# Patient Record
Sex: Female | Born: 1935
Health system: Southern US, Community
[De-identification: ages and names within clinical notes are randomized; demographics above are authoritative.]

## PROBLEM LIST (undated history)

## (undated) DIAGNOSIS — C801 Malignant (primary) neoplasm, unspecified: Secondary | ICD-10-CM

## (undated) DIAGNOSIS — F40240 Claustrophobia: Secondary | ICD-10-CM

## (undated) DIAGNOSIS — I10 Essential (primary) hypertension: Secondary | ICD-10-CM

## (undated) DIAGNOSIS — N289 Disorder of kidney and ureter, unspecified: Secondary | ICD-10-CM

## (undated) HISTORY — PX: NEPHRECTOMY: SHX65

---

## 2015-11-27 DIAGNOSIS — M79674 Pain in right toe(s): Secondary | ICD-10-CM | POA: Diagnosis not present

## 2015-11-27 DIAGNOSIS — L603 Nail dystrophy: Secondary | ICD-10-CM | POA: Diagnosis not present

## 2015-11-27 DIAGNOSIS — I739 Peripheral vascular disease, unspecified: Secondary | ICD-10-CM | POA: Diagnosis not present

## 2015-11-27 DIAGNOSIS — M79675 Pain in left toe(s): Secondary | ICD-10-CM | POA: Diagnosis not present

## 2015-12-17 DIAGNOSIS — H2513 Age-related nuclear cataract, bilateral: Secondary | ICD-10-CM | POA: Diagnosis not present

## 2015-12-17 DIAGNOSIS — H40033 Anatomical narrow angle, bilateral: Secondary | ICD-10-CM | POA: Diagnosis not present

## 2016-01-27 DIAGNOSIS — R159 Full incontinence of feces: Secondary | ICD-10-CM | POA: Diagnosis not present

## 2016-01-27 DIAGNOSIS — F411 Generalized anxiety disorder: Secondary | ICD-10-CM | POA: Diagnosis not present

## 2016-01-27 DIAGNOSIS — K219 Gastro-esophageal reflux disease without esophagitis: Secondary | ICD-10-CM | POA: Diagnosis not present

## 2016-01-27 DIAGNOSIS — I10 Essential (primary) hypertension: Secondary | ICD-10-CM | POA: Diagnosis not present

## 2016-01-27 DIAGNOSIS — R2681 Unsteadiness on feet: Secondary | ICD-10-CM | POA: Diagnosis not present

## 2016-01-29 DIAGNOSIS — L03031 Cellulitis of right toe: Secondary | ICD-10-CM | POA: Diagnosis not present

## 2016-02-24 DIAGNOSIS — T8189XA Other complications of procedures, not elsewhere classified, initial encounter: Secondary | ICD-10-CM | POA: Diagnosis not present

## 2016-03-02 DIAGNOSIS — I10 Essential (primary) hypertension: Secondary | ICD-10-CM | POA: Diagnosis not present

## 2016-03-02 DIAGNOSIS — M1711 Unilateral primary osteoarthritis, right knee: Secondary | ICD-10-CM | POA: Diagnosis not present

## 2016-03-18 DIAGNOSIS — L600 Ingrowing nail: Secondary | ICD-10-CM | POA: Diagnosis not present

## 2016-03-18 DIAGNOSIS — M792 Neuralgia and neuritis, unspecified: Secondary | ICD-10-CM | POA: Diagnosis not present

## 2016-03-18 DIAGNOSIS — I739 Peripheral vascular disease, unspecified: Secondary | ICD-10-CM | POA: Diagnosis not present

## 2016-03-18 DIAGNOSIS — L603 Nail dystrophy: Secondary | ICD-10-CM | POA: Diagnosis not present

## 2016-03-26 DIAGNOSIS — K641 Second degree hemorrhoids: Secondary | ICD-10-CM | POA: Diagnosis not present

## 2016-04-02 DIAGNOSIS — L03032 Cellulitis of left toe: Secondary | ICD-10-CM | POA: Diagnosis not present

## 2016-04-29 DIAGNOSIS — M47812 Spondylosis without myelopathy or radiculopathy, cervical region: Secondary | ICD-10-CM | POA: Diagnosis not present

## 2016-04-29 DIAGNOSIS — S199XXA Unspecified injury of neck, initial encounter: Secondary | ICD-10-CM | POA: Diagnosis not present

## 2016-04-29 DIAGNOSIS — E039 Hypothyroidism, unspecified: Secondary | ICD-10-CM | POA: Diagnosis not present

## 2016-04-29 DIAGNOSIS — M542 Cervicalgia: Secondary | ICD-10-CM | POA: Diagnosis not present

## 2016-04-29 DIAGNOSIS — L989 Disorder of the skin and subcutaneous tissue, unspecified: Secondary | ICD-10-CM | POA: Diagnosis not present

## 2016-06-23 DIAGNOSIS — Z08 Encounter for follow-up examination after completed treatment for malignant neoplasm: Secondary | ICD-10-CM | POA: Diagnosis not present

## 2016-06-23 DIAGNOSIS — C44311 Basal cell carcinoma of skin of nose: Secondary | ICD-10-CM | POA: Diagnosis not present

## 2016-06-23 DIAGNOSIS — Z85828 Personal history of other malignant neoplasm of skin: Secondary | ICD-10-CM | POA: Diagnosis not present

## 2016-07-22 DIAGNOSIS — M792 Neuralgia and neuritis, unspecified: Secondary | ICD-10-CM | POA: Diagnosis not present

## 2016-08-17 DIAGNOSIS — M1711 Unilateral primary osteoarthritis, right knee: Secondary | ICD-10-CM | POA: Diagnosis not present

## 2016-08-17 DIAGNOSIS — Z23 Encounter for immunization: Secondary | ICD-10-CM | POA: Diagnosis not present

## 2016-10-29 DIAGNOSIS — M1711 Unilateral primary osteoarthritis, right knee: Secondary | ICD-10-CM | POA: Diagnosis not present

## 2016-10-29 DIAGNOSIS — R52 Pain, unspecified: Secondary | ICD-10-CM | POA: Diagnosis not present

## 2016-10-29 DIAGNOSIS — M170 Bilateral primary osteoarthritis of knee: Secondary | ICD-10-CM | POA: Diagnosis not present

## 2016-11-11 DIAGNOSIS — M1711 Unilateral primary osteoarthritis, right knee: Secondary | ICD-10-CM | POA: Diagnosis not present

## 2016-11-30 DIAGNOSIS — M1711 Unilateral primary osteoarthritis, right knee: Secondary | ICD-10-CM | POA: Diagnosis not present

## 2016-12-04 DIAGNOSIS — M791 Myalgia: Secondary | ICD-10-CM | POA: Diagnosis not present

## 2016-12-04 DIAGNOSIS — M542 Cervicalgia: Secondary | ICD-10-CM | POA: Diagnosis not present

## 2016-12-04 DIAGNOSIS — M47812 Spondylosis without myelopathy or radiculopathy, cervical region: Secondary | ICD-10-CM | POA: Diagnosis not present

## 2016-12-04 DIAGNOSIS — M40209 Unspecified kyphosis, site unspecified: Secondary | ICD-10-CM | POA: Diagnosis not present

## 2016-12-10 DIAGNOSIS — M1711 Unilateral primary osteoarthritis, right knee: Secondary | ICD-10-CM | POA: Diagnosis not present

## 2016-12-17 DIAGNOSIS — M7741 Metatarsalgia, right foot: Secondary | ICD-10-CM | POA: Diagnosis not present

## 2016-12-17 DIAGNOSIS — L602 Onychogryphosis: Secondary | ICD-10-CM | POA: Diagnosis not present

## 2016-12-17 DIAGNOSIS — M7742 Metatarsalgia, left foot: Secondary | ICD-10-CM | POA: Diagnosis not present

## 2016-12-31 DIAGNOSIS — F411 Generalized anxiety disorder: Secondary | ICD-10-CM | POA: Diagnosis not present

## 2016-12-31 DIAGNOSIS — I10 Essential (primary) hypertension: Secondary | ICD-10-CM | POA: Diagnosis not present

## 2016-12-31 DIAGNOSIS — F410 Panic disorder [episodic paroxysmal anxiety] without agoraphobia: Secondary | ICD-10-CM | POA: Diagnosis not present

## 2017-01-05 DIAGNOSIS — Z85828 Personal history of other malignant neoplasm of skin: Secondary | ICD-10-CM | POA: Diagnosis not present

## 2017-01-05 DIAGNOSIS — Z08 Encounter for follow-up examination after completed treatment for malignant neoplasm: Secondary | ICD-10-CM | POA: Diagnosis not present

## 2017-01-07 DIAGNOSIS — H903 Sensorineural hearing loss, bilateral: Secondary | ICD-10-CM | POA: Diagnosis not present

## 2017-01-08 DIAGNOSIS — J3089 Other allergic rhinitis: Secondary | ICD-10-CM | POA: Diagnosis not present

## 2017-01-08 DIAGNOSIS — I10 Essential (primary) hypertension: Secondary | ICD-10-CM | POA: Diagnosis not present

## 2017-01-08 DIAGNOSIS — F410 Panic disorder [episodic paroxysmal anxiety] without agoraphobia: Secondary | ICD-10-CM | POA: Diagnosis not present

## 2017-04-12 DIAGNOSIS — L602 Onychogryphosis: Secondary | ICD-10-CM | POA: Diagnosis not present

## 2017-05-17 DIAGNOSIS — H04123 Dry eye syndrome of bilateral lacrimal glands: Secondary | ICD-10-CM | POA: Diagnosis not present

## 2017-05-17 DIAGNOSIS — H40033 Anatomical narrow angle, bilateral: Secondary | ICD-10-CM | POA: Diagnosis not present

## 2017-07-08 DIAGNOSIS — Z85828 Personal history of other malignant neoplasm of skin: Secondary | ICD-10-CM | POA: Diagnosis not present

## 2017-07-08 DIAGNOSIS — Z08 Encounter for follow-up examination after completed treatment for malignant neoplasm: Secondary | ICD-10-CM | POA: Diagnosis not present

## 2017-08-04 DIAGNOSIS — Z23 Encounter for immunization: Secondary | ICD-10-CM | POA: Diagnosis not present

## 2017-08-09 DIAGNOSIS — E039 Hypothyroidism, unspecified: Secondary | ICD-10-CM | POA: Diagnosis not present

## 2017-08-09 DIAGNOSIS — E875 Hyperkalemia: Secondary | ICD-10-CM | POA: Diagnosis not present

## 2017-08-11 DIAGNOSIS — M47816 Spondylosis without myelopathy or radiculopathy, lumbar region: Secondary | ICD-10-CM | POA: Diagnosis not present

## 2017-08-12 DIAGNOSIS — M19072 Primary osteoarthritis, left ankle and foot: Secondary | ICD-10-CM | POA: Diagnosis not present

## 2017-08-12 DIAGNOSIS — M19071 Primary osteoarthritis, right ankle and foot: Secondary | ICD-10-CM | POA: Diagnosis not present

## 2017-08-12 DIAGNOSIS — L602 Onychogryphosis: Secondary | ICD-10-CM | POA: Diagnosis not present

## 2017-10-05 DIAGNOSIS — N289 Disorder of kidney and ureter, unspecified: Secondary | ICD-10-CM | POA: Diagnosis not present

## 2017-10-05 DIAGNOSIS — F410 Panic disorder [episodic paroxysmal anxiety] without agoraphobia: Secondary | ICD-10-CM | POA: Diagnosis not present

## 2017-10-05 DIAGNOSIS — F322 Major depressive disorder, single episode, severe without psychotic features: Secondary | ICD-10-CM | POA: Diagnosis not present

## 2017-10-05 DIAGNOSIS — I5022 Chronic systolic (congestive) heart failure: Secondary | ICD-10-CM | POA: Diagnosis not present

## 2017-10-06 DIAGNOSIS — R531 Weakness: Secondary | ICD-10-CM | POA: Diagnosis not present

## 2017-10-06 DIAGNOSIS — K641 Second degree hemorrhoids: Secondary | ICD-10-CM | POA: Diagnosis not present

## 2017-10-06 DIAGNOSIS — E039 Hypothyroidism, unspecified: Secondary | ICD-10-CM | POA: Diagnosis not present

## 2017-10-06 DIAGNOSIS — N390 Urinary tract infection, site not specified: Secondary | ICD-10-CM | POA: Diagnosis not present

## 2017-10-06 DIAGNOSIS — N183 Chronic kidney disease, stage 3 (moderate): Secondary | ICD-10-CM | POA: Diagnosis not present

## 2017-10-06 DIAGNOSIS — I10 Essential (primary) hypertension: Secondary | ICD-10-CM | POA: Diagnosis not present

## 2017-10-06 DIAGNOSIS — R0602 Shortness of breath: Secondary | ICD-10-CM | POA: Diagnosis not present

## 2017-10-06 DIAGNOSIS — I129 Hypertensive chronic kidney disease with stage 1 through stage 4 chronic kidney disease, or unspecified chronic kidney disease: Secondary | ICD-10-CM | POA: Diagnosis not present

## 2017-10-06 DIAGNOSIS — R42 Dizziness and giddiness: Secondary | ICD-10-CM | POA: Diagnosis not present

## 2017-10-06 DIAGNOSIS — K625 Hemorrhage of anus and rectum: Secondary | ICD-10-CM | POA: Diagnosis not present

## 2017-10-06 DIAGNOSIS — D649 Anemia, unspecified: Secondary | ICD-10-CM | POA: Diagnosis not present

## 2017-10-07 DIAGNOSIS — I10 Essential (primary) hypertension: Secondary | ICD-10-CM | POA: Diagnosis not present

## 2017-10-07 DIAGNOSIS — I083 Combined rheumatic disorders of mitral, aortic and tricuspid valves: Secondary | ICD-10-CM | POA: Diagnosis not present

## 2017-10-07 DIAGNOSIS — K641 Second degree hemorrhoids: Secondary | ICD-10-CM | POA: Diagnosis not present

## 2017-10-07 DIAGNOSIS — I517 Cardiomegaly: Secondary | ICD-10-CM | POA: Diagnosis not present

## 2017-10-07 DIAGNOSIS — N183 Chronic kidney disease, stage 3 (moderate): Secondary | ICD-10-CM | POA: Diagnosis not present

## 2017-10-07 DIAGNOSIS — E039 Hypothyroidism, unspecified: Secondary | ICD-10-CM | POA: Diagnosis not present

## 2017-10-07 DIAGNOSIS — D649 Anemia, unspecified: Secondary | ICD-10-CM | POA: Diagnosis not present

## 2017-10-07 DIAGNOSIS — I878 Other specified disorders of veins: Secondary | ICD-10-CM | POA: Diagnosis not present

## 2017-10-07 DIAGNOSIS — I519 Heart disease, unspecified: Secondary | ICD-10-CM | POA: Diagnosis not present

## 2017-10-08 DIAGNOSIS — K219 Gastro-esophageal reflux disease without esophagitis: Secondary | ICD-10-CM | POA: Diagnosis present

## 2017-10-08 DIAGNOSIS — D649 Anemia, unspecified: Secondary | ICD-10-CM | POA: Diagnosis present

## 2017-10-08 DIAGNOSIS — Z888 Allergy status to other drugs, medicaments and biological substances status: Secondary | ICD-10-CM | POA: Diagnosis not present

## 2017-10-08 DIAGNOSIS — F329 Major depressive disorder, single episode, unspecified: Secondary | ICD-10-CM | POA: Diagnosis present

## 2017-10-08 DIAGNOSIS — B9620 Unspecified Escherichia coli [E. coli] as the cause of diseases classified elsewhere: Secondary | ICD-10-CM | POA: Diagnosis present

## 2017-10-08 DIAGNOSIS — I129 Hypertensive chronic kidney disease with stage 1 through stage 4 chronic kidney disease, or unspecified chronic kidney disease: Secondary | ICD-10-CM | POA: Diagnosis present

## 2017-10-08 DIAGNOSIS — Z882 Allergy status to sulfonamides status: Secondary | ICD-10-CM | POA: Diagnosis not present

## 2017-10-08 DIAGNOSIS — F410 Panic disorder [episodic paroxysmal anxiety] without agoraphobia: Secondary | ICD-10-CM | POA: Diagnosis present

## 2017-10-08 DIAGNOSIS — E039 Hypothyroidism, unspecified: Secondary | ICD-10-CM | POA: Diagnosis present

## 2017-10-08 DIAGNOSIS — Z79899 Other long term (current) drug therapy: Secondary | ICD-10-CM | POA: Diagnosis not present

## 2017-10-08 DIAGNOSIS — N183 Chronic kidney disease, stage 3 (moderate): Secondary | ICD-10-CM | POA: Diagnosis present

## 2017-10-08 DIAGNOSIS — Z79891 Long term (current) use of opiate analgesic: Secondary | ICD-10-CM | POA: Diagnosis not present

## 2017-10-08 DIAGNOSIS — K641 Second degree hemorrhoids: Secondary | ICD-10-CM | POA: Diagnosis present

## 2017-10-08 DIAGNOSIS — I10 Essential (primary) hypertension: Secondary | ICD-10-CM | POA: Diagnosis not present

## 2017-10-08 DIAGNOSIS — I272 Pulmonary hypertension, unspecified: Secondary | ICD-10-CM | POA: Diagnosis present

## 2017-10-08 DIAGNOSIS — Z880 Allergy status to penicillin: Secondary | ICD-10-CM | POA: Diagnosis not present

## 2017-10-08 DIAGNOSIS — Z7982 Long term (current) use of aspirin: Secondary | ICD-10-CM | POA: Diagnosis not present

## 2017-10-08 DIAGNOSIS — N390 Urinary tract infection, site not specified: Secondary | ICD-10-CM | POA: Diagnosis present

## 2017-10-12 DIAGNOSIS — Z7982 Long term (current) use of aspirin: Secondary | ICD-10-CM | POA: Diagnosis not present

## 2017-10-12 DIAGNOSIS — K641 Second degree hemorrhoids: Secondary | ICD-10-CM | POA: Diagnosis not present

## 2017-10-12 DIAGNOSIS — I130 Hypertensive heart and chronic kidney disease with heart failure and stage 1 through stage 4 chronic kidney disease, or unspecified chronic kidney disease: Secondary | ICD-10-CM | POA: Diagnosis not present

## 2017-10-12 DIAGNOSIS — F322 Major depressive disorder, single episode, severe without psychotic features: Secondary | ICD-10-CM | POA: Diagnosis not present

## 2017-10-12 DIAGNOSIS — Z87440 Personal history of urinary (tract) infections: Secondary | ICD-10-CM | POA: Diagnosis not present

## 2017-10-12 DIAGNOSIS — N183 Chronic kidney disease, stage 3 (moderate): Secondary | ICD-10-CM | POA: Diagnosis not present

## 2017-10-12 DIAGNOSIS — Z9181 History of falling: Secondary | ICD-10-CM | POA: Diagnosis not present

## 2017-10-12 DIAGNOSIS — Z7951 Long term (current) use of inhaled steroids: Secondary | ICD-10-CM | POA: Diagnosis not present

## 2017-10-12 DIAGNOSIS — E039 Hypothyroidism, unspecified: Secondary | ICD-10-CM | POA: Diagnosis not present

## 2017-10-12 DIAGNOSIS — M47816 Spondylosis without myelopathy or radiculopathy, lumbar region: Secondary | ICD-10-CM | POA: Diagnosis not present

## 2017-10-12 DIAGNOSIS — D631 Anemia in chronic kidney disease: Secondary | ICD-10-CM | POA: Diagnosis not present

## 2017-10-12 DIAGNOSIS — I5022 Chronic systolic (congestive) heart failure: Secondary | ICD-10-CM | POA: Diagnosis not present

## 2017-10-12 DIAGNOSIS — F419 Anxiety disorder, unspecified: Secondary | ICD-10-CM | POA: Diagnosis not present

## 2017-10-20 DIAGNOSIS — I5022 Chronic systolic (congestive) heart failure: Secondary | ICD-10-CM | POA: Diagnosis not present

## 2017-10-20 DIAGNOSIS — N183 Chronic kidney disease, stage 3 (moderate): Secondary | ICD-10-CM | POA: Diagnosis not present

## 2017-10-20 DIAGNOSIS — D631 Anemia in chronic kidney disease: Secondary | ICD-10-CM | POA: Diagnosis not present

## 2017-10-20 DIAGNOSIS — F419 Anxiety disorder, unspecified: Secondary | ICD-10-CM | POA: Diagnosis not present

## 2017-10-20 DIAGNOSIS — K641 Second degree hemorrhoids: Secondary | ICD-10-CM | POA: Diagnosis not present

## 2017-10-20 DIAGNOSIS — I130 Hypertensive heart and chronic kidney disease with heart failure and stage 1 through stage 4 chronic kidney disease, or unspecified chronic kidney disease: Secondary | ICD-10-CM | POA: Diagnosis not present

## 2017-10-21 DIAGNOSIS — K641 Second degree hemorrhoids: Secondary | ICD-10-CM | POA: Diagnosis not present

## 2017-10-21 DIAGNOSIS — I130 Hypertensive heart and chronic kidney disease with heart failure and stage 1 through stage 4 chronic kidney disease, or unspecified chronic kidney disease: Secondary | ICD-10-CM | POA: Diagnosis not present

## 2017-10-21 DIAGNOSIS — F419 Anxiety disorder, unspecified: Secondary | ICD-10-CM | POA: Diagnosis not present

## 2017-10-21 DIAGNOSIS — D631 Anemia in chronic kidney disease: Secondary | ICD-10-CM | POA: Diagnosis not present

## 2017-10-21 DIAGNOSIS — I5022 Chronic systolic (congestive) heart failure: Secondary | ICD-10-CM | POA: Diagnosis not present

## 2017-10-21 DIAGNOSIS — N183 Chronic kidney disease, stage 3 (moderate): Secondary | ICD-10-CM | POA: Diagnosis not present

## 2017-10-23 DIAGNOSIS — I130 Hypertensive heart and chronic kidney disease with heart failure and stage 1 through stage 4 chronic kidney disease, or unspecified chronic kidney disease: Secondary | ICD-10-CM | POA: Diagnosis not present

## 2017-10-23 DIAGNOSIS — F419 Anxiety disorder, unspecified: Secondary | ICD-10-CM | POA: Diagnosis not present

## 2017-10-23 DIAGNOSIS — D631 Anemia in chronic kidney disease: Secondary | ICD-10-CM | POA: Diagnosis not present

## 2017-10-23 DIAGNOSIS — I5022 Chronic systolic (congestive) heart failure: Secondary | ICD-10-CM | POA: Diagnosis not present

## 2017-10-23 DIAGNOSIS — K641 Second degree hemorrhoids: Secondary | ICD-10-CM | POA: Diagnosis not present

## 2017-10-23 DIAGNOSIS — N183 Chronic kidney disease, stage 3 (moderate): Secondary | ICD-10-CM | POA: Diagnosis not present

## 2017-10-24 DIAGNOSIS — I5022 Chronic systolic (congestive) heart failure: Secondary | ICD-10-CM | POA: Diagnosis not present

## 2017-10-24 DIAGNOSIS — K641 Second degree hemorrhoids: Secondary | ICD-10-CM | POA: Diagnosis not present

## 2017-10-24 DIAGNOSIS — I130 Hypertensive heart and chronic kidney disease with heart failure and stage 1 through stage 4 chronic kidney disease, or unspecified chronic kidney disease: Secondary | ICD-10-CM | POA: Diagnosis not present

## 2017-10-24 DIAGNOSIS — D631 Anemia in chronic kidney disease: Secondary | ICD-10-CM | POA: Diagnosis not present

## 2017-10-24 DIAGNOSIS — F419 Anxiety disorder, unspecified: Secondary | ICD-10-CM | POA: Diagnosis not present

## 2017-10-24 DIAGNOSIS — N183 Chronic kidney disease, stage 3 (moderate): Secondary | ICD-10-CM | POA: Diagnosis not present

## 2017-10-26 DIAGNOSIS — I130 Hypertensive heart and chronic kidney disease with heart failure and stage 1 through stage 4 chronic kidney disease, or unspecified chronic kidney disease: Secondary | ICD-10-CM | POA: Diagnosis not present

## 2017-10-26 DIAGNOSIS — F419 Anxiety disorder, unspecified: Secondary | ICD-10-CM | POA: Diagnosis not present

## 2017-10-26 DIAGNOSIS — N183 Chronic kidney disease, stage 3 (moderate): Secondary | ICD-10-CM | POA: Diagnosis not present

## 2017-10-26 DIAGNOSIS — D631 Anemia in chronic kidney disease: Secondary | ICD-10-CM | POA: Diagnosis not present

## 2017-10-26 DIAGNOSIS — K641 Second degree hemorrhoids: Secondary | ICD-10-CM | POA: Diagnosis not present

## 2017-10-26 DIAGNOSIS — I5022 Chronic systolic (congestive) heart failure: Secondary | ICD-10-CM | POA: Diagnosis not present

## 2017-10-28 DIAGNOSIS — F419 Anxiety disorder, unspecified: Secondary | ICD-10-CM | POA: Diagnosis not present

## 2017-10-28 DIAGNOSIS — N183 Chronic kidney disease, stage 3 (moderate): Secondary | ICD-10-CM | POA: Diagnosis not present

## 2017-10-28 DIAGNOSIS — K641 Second degree hemorrhoids: Secondary | ICD-10-CM | POA: Diagnosis not present

## 2017-10-28 DIAGNOSIS — I130 Hypertensive heart and chronic kidney disease with heart failure and stage 1 through stage 4 chronic kidney disease, or unspecified chronic kidney disease: Secondary | ICD-10-CM | POA: Diagnosis not present

## 2017-10-28 DIAGNOSIS — D631 Anemia in chronic kidney disease: Secondary | ICD-10-CM | POA: Diagnosis not present

## 2017-10-28 DIAGNOSIS — I5022 Chronic systolic (congestive) heart failure: Secondary | ICD-10-CM | POA: Diagnosis not present

## 2017-10-29 DIAGNOSIS — I130 Hypertensive heart and chronic kidney disease with heart failure and stage 1 through stage 4 chronic kidney disease, or unspecified chronic kidney disease: Secondary | ICD-10-CM | POA: Diagnosis not present

## 2017-10-29 DIAGNOSIS — D631 Anemia in chronic kidney disease: Secondary | ICD-10-CM | POA: Diagnosis not present

## 2017-10-29 DIAGNOSIS — K641 Second degree hemorrhoids: Secondary | ICD-10-CM | POA: Diagnosis not present

## 2017-10-29 DIAGNOSIS — N183 Chronic kidney disease, stage 3 (moderate): Secondary | ICD-10-CM | POA: Diagnosis not present

## 2017-10-29 DIAGNOSIS — F419 Anxiety disorder, unspecified: Secondary | ICD-10-CM | POA: Diagnosis not present

## 2017-10-29 DIAGNOSIS — I5022 Chronic systolic (congestive) heart failure: Secondary | ICD-10-CM | POA: Diagnosis not present

## 2017-11-04 DIAGNOSIS — I5022 Chronic systolic (congestive) heart failure: Secondary | ICD-10-CM | POA: Diagnosis not present

## 2017-11-04 DIAGNOSIS — N183 Chronic kidney disease, stage 3 (moderate): Secondary | ICD-10-CM | POA: Diagnosis not present

## 2017-11-04 DIAGNOSIS — K641 Second degree hemorrhoids: Secondary | ICD-10-CM | POA: Diagnosis not present

## 2017-11-04 DIAGNOSIS — I130 Hypertensive heart and chronic kidney disease with heart failure and stage 1 through stage 4 chronic kidney disease, or unspecified chronic kidney disease: Secondary | ICD-10-CM | POA: Diagnosis not present

## 2017-11-04 DIAGNOSIS — D631 Anemia in chronic kidney disease: Secondary | ICD-10-CM | POA: Diagnosis not present

## 2017-11-04 DIAGNOSIS — F419 Anxiety disorder, unspecified: Secondary | ICD-10-CM | POA: Diagnosis not present

## 2017-11-05 DIAGNOSIS — K641 Second degree hemorrhoids: Secondary | ICD-10-CM | POA: Diagnosis not present

## 2017-11-05 DIAGNOSIS — N183 Chronic kidney disease, stage 3 (moderate): Secondary | ICD-10-CM | POA: Diagnosis not present

## 2017-11-05 DIAGNOSIS — I130 Hypertensive heart and chronic kidney disease with heart failure and stage 1 through stage 4 chronic kidney disease, or unspecified chronic kidney disease: Secondary | ICD-10-CM | POA: Diagnosis not present

## 2017-11-05 DIAGNOSIS — I5022 Chronic systolic (congestive) heart failure: Secondary | ICD-10-CM | POA: Diagnosis not present

## 2017-11-05 DIAGNOSIS — F419 Anxiety disorder, unspecified: Secondary | ICD-10-CM | POA: Diagnosis not present

## 2017-11-05 DIAGNOSIS — D631 Anemia in chronic kidney disease: Secondary | ICD-10-CM | POA: Diagnosis not present

## 2017-11-12 DIAGNOSIS — I130 Hypertensive heart and chronic kidney disease with heart failure and stage 1 through stage 4 chronic kidney disease, or unspecified chronic kidney disease: Secondary | ICD-10-CM | POA: Diagnosis not present

## 2017-11-12 DIAGNOSIS — K641 Second degree hemorrhoids: Secondary | ICD-10-CM | POA: Diagnosis not present

## 2017-11-12 DIAGNOSIS — D631 Anemia in chronic kidney disease: Secondary | ICD-10-CM | POA: Diagnosis not present

## 2017-11-12 DIAGNOSIS — I5022 Chronic systolic (congestive) heart failure: Secondary | ICD-10-CM | POA: Diagnosis not present

## 2017-11-12 DIAGNOSIS — F419 Anxiety disorder, unspecified: Secondary | ICD-10-CM | POA: Diagnosis not present

## 2017-11-12 DIAGNOSIS — N183 Chronic kidney disease, stage 3 (moderate): Secondary | ICD-10-CM | POA: Diagnosis not present

## 2017-11-13 DIAGNOSIS — F419 Anxiety disorder, unspecified: Secondary | ICD-10-CM | POA: Diagnosis not present

## 2017-11-13 DIAGNOSIS — D631 Anemia in chronic kidney disease: Secondary | ICD-10-CM | POA: Diagnosis not present

## 2017-11-13 DIAGNOSIS — I5022 Chronic systolic (congestive) heart failure: Secondary | ICD-10-CM | POA: Diagnosis not present

## 2017-11-13 DIAGNOSIS — I130 Hypertensive heart and chronic kidney disease with heart failure and stage 1 through stage 4 chronic kidney disease, or unspecified chronic kidney disease: Secondary | ICD-10-CM | POA: Diagnosis not present

## 2017-11-13 DIAGNOSIS — K641 Second degree hemorrhoids: Secondary | ICD-10-CM | POA: Diagnosis not present

## 2017-11-13 DIAGNOSIS — N183 Chronic kidney disease, stage 3 (moderate): Secondary | ICD-10-CM | POA: Diagnosis not present

## 2017-11-15 DIAGNOSIS — N183 Chronic kidney disease, stage 3 (moderate): Secondary | ICD-10-CM | POA: Diagnosis not present

## 2017-11-15 DIAGNOSIS — I5022 Chronic systolic (congestive) heart failure: Secondary | ICD-10-CM | POA: Diagnosis not present

## 2017-11-15 DIAGNOSIS — F419 Anxiety disorder, unspecified: Secondary | ICD-10-CM | POA: Diagnosis not present

## 2017-11-15 DIAGNOSIS — I130 Hypertensive heart and chronic kidney disease with heart failure and stage 1 through stage 4 chronic kidney disease, or unspecified chronic kidney disease: Secondary | ICD-10-CM | POA: Diagnosis not present

## 2017-11-15 DIAGNOSIS — K641 Second degree hemorrhoids: Secondary | ICD-10-CM | POA: Diagnosis not present

## 2017-11-15 DIAGNOSIS — D631 Anemia in chronic kidney disease: Secondary | ICD-10-CM | POA: Diagnosis not present

## 2017-11-18 DIAGNOSIS — I130 Hypertensive heart and chronic kidney disease with heart failure and stage 1 through stage 4 chronic kidney disease, or unspecified chronic kidney disease: Secondary | ICD-10-CM | POA: Diagnosis not present

## 2017-11-18 DIAGNOSIS — D631 Anemia in chronic kidney disease: Secondary | ICD-10-CM | POA: Diagnosis not present

## 2017-11-18 DIAGNOSIS — F419 Anxiety disorder, unspecified: Secondary | ICD-10-CM | POA: Diagnosis not present

## 2017-11-18 DIAGNOSIS — I5022 Chronic systolic (congestive) heart failure: Secondary | ICD-10-CM | POA: Diagnosis not present

## 2017-11-18 DIAGNOSIS — K641 Second degree hemorrhoids: Secondary | ICD-10-CM | POA: Diagnosis not present

## 2017-11-18 DIAGNOSIS — N183 Chronic kidney disease, stage 3 (moderate): Secondary | ICD-10-CM | POA: Diagnosis not present

## 2017-11-19 DIAGNOSIS — K641 Second degree hemorrhoids: Secondary | ICD-10-CM | POA: Diagnosis not present

## 2017-11-19 DIAGNOSIS — E039 Hypothyroidism, unspecified: Secondary | ICD-10-CM | POA: Diagnosis not present

## 2017-11-19 DIAGNOSIS — F322 Major depressive disorder, single episode, severe without psychotic features: Secondary | ICD-10-CM | POA: Diagnosis not present

## 2017-11-19 DIAGNOSIS — D631 Anemia in chronic kidney disease: Secondary | ICD-10-CM | POA: Diagnosis not present

## 2017-11-19 DIAGNOSIS — I130 Hypertensive heart and chronic kidney disease with heart failure and stage 1 through stage 4 chronic kidney disease, or unspecified chronic kidney disease: Secondary | ICD-10-CM | POA: Diagnosis not present

## 2017-11-19 DIAGNOSIS — R609 Edema, unspecified: Secondary | ICD-10-CM | POA: Diagnosis not present

## 2017-11-19 DIAGNOSIS — R5381 Other malaise: Secondary | ICD-10-CM | POA: Diagnosis not present

## 2017-11-19 DIAGNOSIS — N183 Chronic kidney disease, stage 3 (moderate): Secondary | ICD-10-CM | POA: Diagnosis not present

## 2017-11-19 DIAGNOSIS — M542 Cervicalgia: Secondary | ICD-10-CM | POA: Diagnosis not present

## 2017-11-19 DIAGNOSIS — F419 Anxiety disorder, unspecified: Secondary | ICD-10-CM | POA: Diagnosis not present

## 2017-11-19 DIAGNOSIS — N289 Disorder of kidney and ureter, unspecified: Secondary | ICD-10-CM | POA: Diagnosis not present

## 2017-11-19 DIAGNOSIS — I5022 Chronic systolic (congestive) heart failure: Secondary | ICD-10-CM | POA: Diagnosis not present

## 2017-12-01 DIAGNOSIS — N184 Chronic kidney disease, stage 4 (severe): Secondary | ICD-10-CM | POA: Diagnosis not present

## 2017-12-16 DIAGNOSIS — L602 Onychogryphosis: Secondary | ICD-10-CM | POA: Diagnosis not present

## 2017-12-17 DIAGNOSIS — N184 Chronic kidney disease, stage 4 (severe): Secondary | ICD-10-CM | POA: Diagnosis not present

## 2018-01-10 DIAGNOSIS — N184 Chronic kidney disease, stage 4 (severe): Secondary | ICD-10-CM | POA: Diagnosis not present

## 2018-02-01 DIAGNOSIS — R5381 Other malaise: Secondary | ICD-10-CM | POA: Diagnosis not present

## 2018-02-01 DIAGNOSIS — I10 Essential (primary) hypertension: Secondary | ICD-10-CM | POA: Diagnosis not present

## 2018-02-01 DIAGNOSIS — F410 Panic disorder [episodic paroxysmal anxiety] without agoraphobia: Secondary | ICD-10-CM | POA: Diagnosis not present

## 2018-02-23 DIAGNOSIS — D631 Anemia in chronic kidney disease: Secondary | ICD-10-CM | POA: Diagnosis not present

## 2018-02-23 DIAGNOSIS — I130 Hypertensive heart and chronic kidney disease with heart failure and stage 1 through stage 4 chronic kidney disease, or unspecified chronic kidney disease: Secondary | ICD-10-CM | POA: Diagnosis not present

## 2018-02-23 DIAGNOSIS — Z9181 History of falling: Secondary | ICD-10-CM | POA: Diagnosis not present

## 2018-02-23 DIAGNOSIS — Z87440 Personal history of urinary (tract) infections: Secondary | ICD-10-CM | POA: Diagnosis not present

## 2018-02-23 DIAGNOSIS — Z7982 Long term (current) use of aspirin: Secondary | ICD-10-CM | POA: Diagnosis not present

## 2018-02-23 DIAGNOSIS — F322 Major depressive disorder, single episode, severe without psychotic features: Secondary | ICD-10-CM | POA: Diagnosis not present

## 2018-02-23 DIAGNOSIS — K641 Second degree hemorrhoids: Secondary | ICD-10-CM | POA: Diagnosis not present

## 2018-02-23 DIAGNOSIS — I5022 Chronic systolic (congestive) heart failure: Secondary | ICD-10-CM | POA: Diagnosis not present

## 2018-02-23 DIAGNOSIS — I272 Pulmonary hypertension, unspecified: Secondary | ICD-10-CM | POA: Diagnosis not present

## 2018-02-23 DIAGNOSIS — F410 Panic disorder [episodic paroxysmal anxiety] without agoraphobia: Secondary | ICD-10-CM | POA: Diagnosis not present

## 2018-02-23 DIAGNOSIS — M47816 Spondylosis without myelopathy or radiculopathy, lumbar region: Secondary | ICD-10-CM | POA: Diagnosis not present

## 2018-02-23 DIAGNOSIS — Z7951 Long term (current) use of inhaled steroids: Secondary | ICD-10-CM | POA: Diagnosis not present

## 2018-02-23 DIAGNOSIS — E039 Hypothyroidism, unspecified: Secondary | ICD-10-CM | POA: Diagnosis not present

## 2018-02-23 DIAGNOSIS — N183 Chronic kidney disease, stage 3 (moderate): Secondary | ICD-10-CM | POA: Diagnosis not present

## 2018-02-28 DIAGNOSIS — M47816 Spondylosis without myelopathy or radiculopathy, lumbar region: Secondary | ICD-10-CM | POA: Diagnosis not present

## 2018-02-28 DIAGNOSIS — D631 Anemia in chronic kidney disease: Secondary | ICD-10-CM | POA: Diagnosis not present

## 2018-02-28 DIAGNOSIS — I130 Hypertensive heart and chronic kidney disease with heart failure and stage 1 through stage 4 chronic kidney disease, or unspecified chronic kidney disease: Secondary | ICD-10-CM | POA: Diagnosis not present

## 2018-02-28 DIAGNOSIS — I5022 Chronic systolic (congestive) heart failure: Secondary | ICD-10-CM | POA: Diagnosis not present

## 2018-02-28 DIAGNOSIS — F410 Panic disorder [episodic paroxysmal anxiety] without agoraphobia: Secondary | ICD-10-CM | POA: Diagnosis not present

## 2018-02-28 DIAGNOSIS — N183 Chronic kidney disease, stage 3 (moderate): Secondary | ICD-10-CM | POA: Diagnosis not present

## 2018-03-02 DIAGNOSIS — M47816 Spondylosis without myelopathy or radiculopathy, lumbar region: Secondary | ICD-10-CM | POA: Diagnosis not present

## 2018-03-02 DIAGNOSIS — F410 Panic disorder [episodic paroxysmal anxiety] without agoraphobia: Secondary | ICD-10-CM | POA: Diagnosis not present

## 2018-03-02 DIAGNOSIS — I5022 Chronic systolic (congestive) heart failure: Secondary | ICD-10-CM | POA: Diagnosis not present

## 2018-03-02 DIAGNOSIS — D631 Anemia in chronic kidney disease: Secondary | ICD-10-CM | POA: Diagnosis not present

## 2018-03-02 DIAGNOSIS — I130 Hypertensive heart and chronic kidney disease with heart failure and stage 1 through stage 4 chronic kidney disease, or unspecified chronic kidney disease: Secondary | ICD-10-CM | POA: Diagnosis not present

## 2018-03-02 DIAGNOSIS — N183 Chronic kidney disease, stage 3 (moderate): Secondary | ICD-10-CM | POA: Diagnosis not present

## 2018-03-03 DIAGNOSIS — D62 Acute posthemorrhagic anemia: Secondary | ICD-10-CM | POA: Diagnosis not present

## 2018-03-03 DIAGNOSIS — K219 Gastro-esophageal reflux disease without esophagitis: Secondary | ICD-10-CM | POA: Diagnosis not present

## 2018-03-03 DIAGNOSIS — R531 Weakness: Secondary | ICD-10-CM | POA: Diagnosis not present

## 2018-03-03 DIAGNOSIS — F410 Panic disorder [episodic paroxysmal anxiety] without agoraphobia: Secondary | ICD-10-CM | POA: Diagnosis not present

## 2018-03-03 DIAGNOSIS — K5730 Diverticulosis of large intestine without perforation or abscess without bleeding: Secondary | ICD-10-CM | POA: Diagnosis not present

## 2018-03-03 DIAGNOSIS — I130 Hypertensive heart and chronic kidney disease with heart failure and stage 1 through stage 4 chronic kidney disease, or unspecified chronic kidney disease: Secondary | ICD-10-CM | POA: Diagnosis not present

## 2018-03-03 DIAGNOSIS — K625 Hemorrhage of anus and rectum: Secondary | ICD-10-CM | POA: Diagnosis not present

## 2018-03-03 DIAGNOSIS — Z7982 Long term (current) use of aspirin: Secondary | ICD-10-CM | POA: Diagnosis not present

## 2018-03-03 DIAGNOSIS — R5383 Other fatigue: Secondary | ICD-10-CM | POA: Diagnosis not present

## 2018-03-03 DIAGNOSIS — N3001 Acute cystitis with hematuria: Secondary | ICD-10-CM | POA: Diagnosis not present

## 2018-03-03 DIAGNOSIS — M47816 Spondylosis without myelopathy or radiculopathy, lumbar region: Secondary | ICD-10-CM | POA: Diagnosis not present

## 2018-03-03 DIAGNOSIS — D259 Leiomyoma of uterus, unspecified: Secondary | ICD-10-CM | POA: Diagnosis not present

## 2018-03-03 DIAGNOSIS — R1084 Generalized abdominal pain: Secondary | ICD-10-CM | POA: Diagnosis not present

## 2018-03-03 DIAGNOSIS — D631 Anemia in chronic kidney disease: Secondary | ICD-10-CM | POA: Diagnosis not present

## 2018-03-03 DIAGNOSIS — K8689 Other specified diseases of pancreas: Secondary | ICD-10-CM | POA: Diagnosis not present

## 2018-03-03 DIAGNOSIS — K922 Gastrointestinal hemorrhage, unspecified: Secondary | ICD-10-CM | POA: Diagnosis not present

## 2018-03-03 DIAGNOSIS — N184 Chronic kidney disease, stage 4 (severe): Secondary | ICD-10-CM | POA: Diagnosis not present

## 2018-03-03 DIAGNOSIS — N183 Chronic kidney disease, stage 3 (moderate): Secondary | ICD-10-CM | POA: Diagnosis not present

## 2018-03-03 DIAGNOSIS — I5022 Chronic systolic (congestive) heart failure: Secondary | ICD-10-CM | POA: Diagnosis not present

## 2018-03-04 DIAGNOSIS — E039 Hypothyroidism, unspecified: Secondary | ICD-10-CM | POA: Diagnosis present

## 2018-03-04 DIAGNOSIS — N184 Chronic kidney disease, stage 4 (severe): Secondary | ICD-10-CM | POA: Diagnosis present

## 2018-03-04 DIAGNOSIS — Z888 Allergy status to other drugs, medicaments and biological substances status: Secondary | ICD-10-CM | POA: Diagnosis not present

## 2018-03-04 DIAGNOSIS — I10 Essential (primary) hypertension: Secondary | ICD-10-CM | POA: Diagnosis not present

## 2018-03-04 DIAGNOSIS — Z882 Allergy status to sulfonamides status: Secondary | ICD-10-CM | POA: Diagnosis not present

## 2018-03-04 DIAGNOSIS — Z91041 Radiographic dye allergy status: Secondary | ICD-10-CM | POA: Diagnosis not present

## 2018-03-04 DIAGNOSIS — D62 Acute posthemorrhagic anemia: Secondary | ICD-10-CM | POA: Diagnosis present

## 2018-03-04 DIAGNOSIS — I129 Hypertensive chronic kidney disease with stage 1 through stage 4 chronic kidney disease, or unspecified chronic kidney disease: Secondary | ICD-10-CM | POA: Diagnosis present

## 2018-03-04 DIAGNOSIS — I272 Pulmonary hypertension, unspecified: Secondary | ICD-10-CM | POA: Diagnosis present

## 2018-03-04 DIAGNOSIS — K219 Gastro-esophageal reflux disease without esophagitis: Secondary | ICD-10-CM | POA: Diagnosis present

## 2018-03-04 DIAGNOSIS — Z7982 Long term (current) use of aspirin: Secondary | ICD-10-CM | POA: Diagnosis not present

## 2018-03-04 DIAGNOSIS — K649 Unspecified hemorrhoids: Secondary | ICD-10-CM | POA: Diagnosis present

## 2018-03-04 DIAGNOSIS — N3001 Acute cystitis with hematuria: Secondary | ICD-10-CM | POA: Diagnosis present

## 2018-03-04 DIAGNOSIS — K625 Hemorrhage of anus and rectum: Secondary | ICD-10-CM | POA: Diagnosis not present

## 2018-03-04 DIAGNOSIS — K922 Gastrointestinal hemorrhage, unspecified: Secondary | ICD-10-CM | POA: Diagnosis present

## 2018-03-04 DIAGNOSIS — Z881 Allergy status to other antibiotic agents status: Secondary | ICD-10-CM | POA: Diagnosis not present

## 2018-03-04 DIAGNOSIS — Z880 Allergy status to penicillin: Secondary | ICD-10-CM | POA: Diagnosis not present

## 2018-03-07 DIAGNOSIS — D631 Anemia in chronic kidney disease: Secondary | ICD-10-CM | POA: Diagnosis not present

## 2018-03-07 DIAGNOSIS — F410 Panic disorder [episodic paroxysmal anxiety] without agoraphobia: Secondary | ICD-10-CM | POA: Diagnosis not present

## 2018-03-07 DIAGNOSIS — N183 Chronic kidney disease, stage 3 (moderate): Secondary | ICD-10-CM | POA: Diagnosis not present

## 2018-03-07 DIAGNOSIS — I5022 Chronic systolic (congestive) heart failure: Secondary | ICD-10-CM | POA: Diagnosis not present

## 2018-03-07 DIAGNOSIS — M47816 Spondylosis without myelopathy or radiculopathy, lumbar region: Secondary | ICD-10-CM | POA: Diagnosis not present

## 2018-03-07 DIAGNOSIS — I130 Hypertensive heart and chronic kidney disease with heart failure and stage 1 through stage 4 chronic kidney disease, or unspecified chronic kidney disease: Secondary | ICD-10-CM | POA: Diagnosis not present

## 2018-03-08 DIAGNOSIS — D631 Anemia in chronic kidney disease: Secondary | ICD-10-CM | POA: Diagnosis not present

## 2018-03-08 DIAGNOSIS — F410 Panic disorder [episodic paroxysmal anxiety] without agoraphobia: Secondary | ICD-10-CM | POA: Diagnosis not present

## 2018-03-08 DIAGNOSIS — N183 Chronic kidney disease, stage 3 (moderate): Secondary | ICD-10-CM | POA: Diagnosis not present

## 2018-03-08 DIAGNOSIS — I130 Hypertensive heart and chronic kidney disease with heart failure and stage 1 through stage 4 chronic kidney disease, or unspecified chronic kidney disease: Secondary | ICD-10-CM | POA: Diagnosis not present

## 2018-03-08 DIAGNOSIS — I5022 Chronic systolic (congestive) heart failure: Secondary | ICD-10-CM | POA: Diagnosis not present

## 2018-03-08 DIAGNOSIS — M47816 Spondylosis without myelopathy or radiculopathy, lumbar region: Secondary | ICD-10-CM | POA: Diagnosis not present

## 2018-03-09 DIAGNOSIS — D631 Anemia in chronic kidney disease: Secondary | ICD-10-CM | POA: Diagnosis not present

## 2018-03-09 DIAGNOSIS — N183 Chronic kidney disease, stage 3 (moderate): Secondary | ICD-10-CM | POA: Diagnosis not present

## 2018-03-09 DIAGNOSIS — I130 Hypertensive heart and chronic kidney disease with heart failure and stage 1 through stage 4 chronic kidney disease, or unspecified chronic kidney disease: Secondary | ICD-10-CM | POA: Diagnosis not present

## 2018-03-09 DIAGNOSIS — I5022 Chronic systolic (congestive) heart failure: Secondary | ICD-10-CM | POA: Diagnosis not present

## 2018-03-09 DIAGNOSIS — R198 Other specified symptoms and signs involving the digestive system and abdomen: Secondary | ICD-10-CM | POA: Diagnosis not present

## 2018-03-09 DIAGNOSIS — M47816 Spondylosis without myelopathy or radiculopathy, lumbar region: Secondary | ICD-10-CM | POA: Diagnosis not present

## 2018-03-09 DIAGNOSIS — K625 Hemorrhage of anus and rectum: Secondary | ICD-10-CM | POA: Diagnosis not present

## 2018-03-09 DIAGNOSIS — D649 Anemia, unspecified: Secondary | ICD-10-CM | POA: Diagnosis not present

## 2018-03-09 DIAGNOSIS — F410 Panic disorder [episodic paroxysmal anxiety] without agoraphobia: Secondary | ICD-10-CM | POA: Diagnosis not present

## 2018-03-10 DIAGNOSIS — F410 Panic disorder [episodic paroxysmal anxiety] without agoraphobia: Secondary | ICD-10-CM | POA: Diagnosis not present

## 2018-03-10 DIAGNOSIS — I130 Hypertensive heart and chronic kidney disease with heart failure and stage 1 through stage 4 chronic kidney disease, or unspecified chronic kidney disease: Secondary | ICD-10-CM | POA: Diagnosis not present

## 2018-03-10 DIAGNOSIS — I5022 Chronic systolic (congestive) heart failure: Secondary | ICD-10-CM | POA: Diagnosis not present

## 2018-03-10 DIAGNOSIS — N183 Chronic kidney disease, stage 3 (moderate): Secondary | ICD-10-CM | POA: Diagnosis not present

## 2018-03-10 DIAGNOSIS — M47816 Spondylosis without myelopathy or radiculopathy, lumbar region: Secondary | ICD-10-CM | POA: Diagnosis not present

## 2018-03-10 DIAGNOSIS — D631 Anemia in chronic kidney disease: Secondary | ICD-10-CM | POA: Diagnosis not present

## 2018-03-16 DIAGNOSIS — D631 Anemia in chronic kidney disease: Secondary | ICD-10-CM | POA: Diagnosis not present

## 2018-03-16 DIAGNOSIS — M47816 Spondylosis without myelopathy or radiculopathy, lumbar region: Secondary | ICD-10-CM | POA: Diagnosis not present

## 2018-03-16 DIAGNOSIS — F410 Panic disorder [episodic paroxysmal anxiety] without agoraphobia: Secondary | ICD-10-CM | POA: Diagnosis not present

## 2018-03-16 DIAGNOSIS — I5022 Chronic systolic (congestive) heart failure: Secondary | ICD-10-CM | POA: Diagnosis not present

## 2018-03-16 DIAGNOSIS — N183 Chronic kidney disease, stage 3 (moderate): Secondary | ICD-10-CM | POA: Diagnosis not present

## 2018-03-16 DIAGNOSIS — I130 Hypertensive heart and chronic kidney disease with heart failure and stage 1 through stage 4 chronic kidney disease, or unspecified chronic kidney disease: Secondary | ICD-10-CM | POA: Diagnosis not present

## 2018-03-17 DIAGNOSIS — I130 Hypertensive heart and chronic kidney disease with heart failure and stage 1 through stage 4 chronic kidney disease, or unspecified chronic kidney disease: Secondary | ICD-10-CM | POA: Diagnosis not present

## 2018-03-17 DIAGNOSIS — F410 Panic disorder [episodic paroxysmal anxiety] without agoraphobia: Secondary | ICD-10-CM | POA: Diagnosis not present

## 2018-03-17 DIAGNOSIS — I5022 Chronic systolic (congestive) heart failure: Secondary | ICD-10-CM | POA: Diagnosis not present

## 2018-03-17 DIAGNOSIS — N183 Chronic kidney disease, stage 3 (moderate): Secondary | ICD-10-CM | POA: Diagnosis not present

## 2018-03-17 DIAGNOSIS — M47816 Spondylosis without myelopathy or radiculopathy, lumbar region: Secondary | ICD-10-CM | POA: Diagnosis not present

## 2018-03-17 DIAGNOSIS — D631 Anemia in chronic kidney disease: Secondary | ICD-10-CM | POA: Diagnosis not present

## 2018-03-22 DIAGNOSIS — F410 Panic disorder [episodic paroxysmal anxiety] without agoraphobia: Secondary | ICD-10-CM | POA: Diagnosis not present

## 2018-03-22 DIAGNOSIS — M47816 Spondylosis without myelopathy or radiculopathy, lumbar region: Secondary | ICD-10-CM | POA: Diagnosis not present

## 2018-03-22 DIAGNOSIS — I130 Hypertensive heart and chronic kidney disease with heart failure and stage 1 through stage 4 chronic kidney disease, or unspecified chronic kidney disease: Secondary | ICD-10-CM | POA: Diagnosis not present

## 2018-03-22 DIAGNOSIS — I5022 Chronic systolic (congestive) heart failure: Secondary | ICD-10-CM | POA: Diagnosis not present

## 2018-03-22 DIAGNOSIS — N183 Chronic kidney disease, stage 3 (moderate): Secondary | ICD-10-CM | POA: Diagnosis not present

## 2018-03-22 DIAGNOSIS — D631 Anemia in chronic kidney disease: Secondary | ICD-10-CM | POA: Diagnosis not present

## 2018-03-23 DIAGNOSIS — D649 Anemia, unspecified: Secondary | ICD-10-CM | POA: Diagnosis not present

## 2018-03-24 DIAGNOSIS — F410 Panic disorder [episodic paroxysmal anxiety] without agoraphobia: Secondary | ICD-10-CM | POA: Diagnosis not present

## 2018-03-24 DIAGNOSIS — N183 Chronic kidney disease, stage 3 (moderate): Secondary | ICD-10-CM | POA: Diagnosis not present

## 2018-03-24 DIAGNOSIS — M47816 Spondylosis without myelopathy or radiculopathy, lumbar region: Secondary | ICD-10-CM | POA: Diagnosis not present

## 2018-03-24 DIAGNOSIS — I5022 Chronic systolic (congestive) heart failure: Secondary | ICD-10-CM | POA: Diagnosis not present

## 2018-03-24 DIAGNOSIS — D631 Anemia in chronic kidney disease: Secondary | ICD-10-CM | POA: Diagnosis not present

## 2018-03-24 DIAGNOSIS — I130 Hypertensive heart and chronic kidney disease with heart failure and stage 1 through stage 4 chronic kidney disease, or unspecified chronic kidney disease: Secondary | ICD-10-CM | POA: Diagnosis not present

## 2018-03-25 DIAGNOSIS — I130 Hypertensive heart and chronic kidney disease with heart failure and stage 1 through stage 4 chronic kidney disease, or unspecified chronic kidney disease: Secondary | ICD-10-CM | POA: Diagnosis not present

## 2018-03-25 DIAGNOSIS — F410 Panic disorder [episodic paroxysmal anxiety] without agoraphobia: Secondary | ICD-10-CM | POA: Diagnosis not present

## 2018-03-25 DIAGNOSIS — N183 Chronic kidney disease, stage 3 (moderate): Secondary | ICD-10-CM | POA: Diagnosis not present

## 2018-03-25 DIAGNOSIS — D631 Anemia in chronic kidney disease: Secondary | ICD-10-CM | POA: Diagnosis not present

## 2018-03-25 DIAGNOSIS — M47816 Spondylosis without myelopathy or radiculopathy, lumbar region: Secondary | ICD-10-CM | POA: Diagnosis not present

## 2018-03-25 DIAGNOSIS — I5022 Chronic systolic (congestive) heart failure: Secondary | ICD-10-CM | POA: Diagnosis not present

## 2018-03-29 DIAGNOSIS — I130 Hypertensive heart and chronic kidney disease with heart failure and stage 1 through stage 4 chronic kidney disease, or unspecified chronic kidney disease: Secondary | ICD-10-CM | POA: Diagnosis not present

## 2018-03-29 DIAGNOSIS — F410 Panic disorder [episodic paroxysmal anxiety] without agoraphobia: Secondary | ICD-10-CM | POA: Diagnosis not present

## 2018-03-29 DIAGNOSIS — N183 Chronic kidney disease, stage 3 (moderate): Secondary | ICD-10-CM | POA: Diagnosis not present

## 2018-03-29 DIAGNOSIS — I5022 Chronic systolic (congestive) heart failure: Secondary | ICD-10-CM | POA: Diagnosis not present

## 2018-03-29 DIAGNOSIS — D631 Anemia in chronic kidney disease: Secondary | ICD-10-CM | POA: Diagnosis not present

## 2018-03-29 DIAGNOSIS — M47816 Spondylosis without myelopathy or radiculopathy, lumbar region: Secondary | ICD-10-CM | POA: Diagnosis not present

## 2018-03-30 DIAGNOSIS — I5022 Chronic systolic (congestive) heart failure: Secondary | ICD-10-CM | POA: Diagnosis not present

## 2018-03-30 DIAGNOSIS — D631 Anemia in chronic kidney disease: Secondary | ICD-10-CM | POA: Diagnosis not present

## 2018-03-30 DIAGNOSIS — M47816 Spondylosis without myelopathy or radiculopathy, lumbar region: Secondary | ICD-10-CM | POA: Diagnosis not present

## 2018-03-30 DIAGNOSIS — F410 Panic disorder [episodic paroxysmal anxiety] without agoraphobia: Secondary | ICD-10-CM | POA: Diagnosis not present

## 2018-03-30 DIAGNOSIS — N183 Chronic kidney disease, stage 3 (moderate): Secondary | ICD-10-CM | POA: Diagnosis not present

## 2018-03-30 DIAGNOSIS — I130 Hypertensive heart and chronic kidney disease with heart failure and stage 1 through stage 4 chronic kidney disease, or unspecified chronic kidney disease: Secondary | ICD-10-CM | POA: Diagnosis not present

## 2018-03-31 DIAGNOSIS — D631 Anemia in chronic kidney disease: Secondary | ICD-10-CM | POA: Diagnosis not present

## 2018-03-31 DIAGNOSIS — N183 Chronic kidney disease, stage 3 (moderate): Secondary | ICD-10-CM | POA: Diagnosis not present

## 2018-03-31 DIAGNOSIS — I5022 Chronic systolic (congestive) heart failure: Secondary | ICD-10-CM | POA: Diagnosis not present

## 2018-03-31 DIAGNOSIS — I130 Hypertensive heart and chronic kidney disease with heart failure and stage 1 through stage 4 chronic kidney disease, or unspecified chronic kidney disease: Secondary | ICD-10-CM | POA: Diagnosis not present

## 2018-03-31 DIAGNOSIS — F410 Panic disorder [episodic paroxysmal anxiety] without agoraphobia: Secondary | ICD-10-CM | POA: Diagnosis not present

## 2018-03-31 DIAGNOSIS — M47816 Spondylosis without myelopathy or radiculopathy, lumbar region: Secondary | ICD-10-CM | POA: Diagnosis not present

## 2018-04-01 DIAGNOSIS — I5022 Chronic systolic (congestive) heart failure: Secondary | ICD-10-CM | POA: Diagnosis not present

## 2018-04-01 DIAGNOSIS — F410 Panic disorder [episodic paroxysmal anxiety] without agoraphobia: Secondary | ICD-10-CM | POA: Diagnosis not present

## 2018-04-01 DIAGNOSIS — N183 Chronic kidney disease, stage 3 (moderate): Secondary | ICD-10-CM | POA: Diagnosis not present

## 2018-04-01 DIAGNOSIS — D631 Anemia in chronic kidney disease: Secondary | ICD-10-CM | POA: Diagnosis not present

## 2018-04-01 DIAGNOSIS — I130 Hypertensive heart and chronic kidney disease with heart failure and stage 1 through stage 4 chronic kidney disease, or unspecified chronic kidney disease: Secondary | ICD-10-CM | POA: Diagnosis not present

## 2018-04-01 DIAGNOSIS — M47816 Spondylosis without myelopathy or radiculopathy, lumbar region: Secondary | ICD-10-CM | POA: Diagnosis not present

## 2018-04-07 DIAGNOSIS — N183 Chronic kidney disease, stage 3 (moderate): Secondary | ICD-10-CM | POA: Diagnosis not present

## 2018-04-07 DIAGNOSIS — I130 Hypertensive heart and chronic kidney disease with heart failure and stage 1 through stage 4 chronic kidney disease, or unspecified chronic kidney disease: Secondary | ICD-10-CM | POA: Diagnosis not present

## 2018-04-07 DIAGNOSIS — D631 Anemia in chronic kidney disease: Secondary | ICD-10-CM | POA: Diagnosis not present

## 2018-04-07 DIAGNOSIS — F410 Panic disorder [episodic paroxysmal anxiety] without agoraphobia: Secondary | ICD-10-CM | POA: Diagnosis not present

## 2018-04-07 DIAGNOSIS — I5022 Chronic systolic (congestive) heart failure: Secondary | ICD-10-CM | POA: Diagnosis not present

## 2018-04-07 DIAGNOSIS — M47816 Spondylosis without myelopathy or radiculopathy, lumbar region: Secondary | ICD-10-CM | POA: Diagnosis not present

## 2018-04-08 DIAGNOSIS — N183 Chronic kidney disease, stage 3 (moderate): Secondary | ICD-10-CM | POA: Diagnosis not present

## 2018-04-08 DIAGNOSIS — F410 Panic disorder [episodic paroxysmal anxiety] without agoraphobia: Secondary | ICD-10-CM | POA: Diagnosis not present

## 2018-04-08 DIAGNOSIS — I130 Hypertensive heart and chronic kidney disease with heart failure and stage 1 through stage 4 chronic kidney disease, or unspecified chronic kidney disease: Secondary | ICD-10-CM | POA: Diagnosis not present

## 2018-04-08 DIAGNOSIS — M47816 Spondylosis without myelopathy or radiculopathy, lumbar region: Secondary | ICD-10-CM | POA: Diagnosis not present

## 2018-04-08 DIAGNOSIS — D631 Anemia in chronic kidney disease: Secondary | ICD-10-CM | POA: Diagnosis not present

## 2018-04-08 DIAGNOSIS — I5022 Chronic systolic (congestive) heart failure: Secondary | ICD-10-CM | POA: Diagnosis not present

## 2018-04-12 DIAGNOSIS — I5022 Chronic systolic (congestive) heart failure: Secondary | ICD-10-CM | POA: Diagnosis not present

## 2018-04-12 DIAGNOSIS — D631 Anemia in chronic kidney disease: Secondary | ICD-10-CM | POA: Diagnosis not present

## 2018-04-12 DIAGNOSIS — F410 Panic disorder [episodic paroxysmal anxiety] without agoraphobia: Secondary | ICD-10-CM | POA: Diagnosis not present

## 2018-04-12 DIAGNOSIS — N183 Chronic kidney disease, stage 3 (moderate): Secondary | ICD-10-CM | POA: Diagnosis not present

## 2018-04-12 DIAGNOSIS — I130 Hypertensive heart and chronic kidney disease with heart failure and stage 1 through stage 4 chronic kidney disease, or unspecified chronic kidney disease: Secondary | ICD-10-CM | POA: Diagnosis not present

## 2018-04-12 DIAGNOSIS — M47816 Spondylosis without myelopathy or radiculopathy, lumbar region: Secondary | ICD-10-CM | POA: Diagnosis not present

## 2018-04-14 DIAGNOSIS — L602 Onychogryphosis: Secondary | ICD-10-CM | POA: Diagnosis not present

## 2018-04-15 DIAGNOSIS — N183 Chronic kidney disease, stage 3 (moderate): Secondary | ICD-10-CM | POA: Diagnosis not present

## 2018-04-15 DIAGNOSIS — D631 Anemia in chronic kidney disease: Secondary | ICD-10-CM | POA: Diagnosis not present

## 2018-04-15 DIAGNOSIS — I130 Hypertensive heart and chronic kidney disease with heart failure and stage 1 through stage 4 chronic kidney disease, or unspecified chronic kidney disease: Secondary | ICD-10-CM | POA: Diagnosis not present

## 2018-04-15 DIAGNOSIS — M47816 Spondylosis without myelopathy or radiculopathy, lumbar region: Secondary | ICD-10-CM | POA: Diagnosis not present

## 2018-04-15 DIAGNOSIS — I5022 Chronic systolic (congestive) heart failure: Secondary | ICD-10-CM | POA: Diagnosis not present

## 2018-04-15 DIAGNOSIS — F410 Panic disorder [episodic paroxysmal anxiety] without agoraphobia: Secondary | ICD-10-CM | POA: Diagnosis not present

## 2018-04-19 DIAGNOSIS — M47816 Spondylosis without myelopathy or radiculopathy, lumbar region: Secondary | ICD-10-CM | POA: Diagnosis not present

## 2018-04-19 DIAGNOSIS — F410 Panic disorder [episodic paroxysmal anxiety] without agoraphobia: Secondary | ICD-10-CM | POA: Diagnosis not present

## 2018-04-19 DIAGNOSIS — D631 Anemia in chronic kidney disease: Secondary | ICD-10-CM | POA: Diagnosis not present

## 2018-04-19 DIAGNOSIS — N183 Chronic kidney disease, stage 3 (moderate): Secondary | ICD-10-CM | POA: Diagnosis not present

## 2018-04-19 DIAGNOSIS — I5022 Chronic systolic (congestive) heart failure: Secondary | ICD-10-CM | POA: Diagnosis not present

## 2018-04-19 DIAGNOSIS — I130 Hypertensive heart and chronic kidney disease with heart failure and stage 1 through stage 4 chronic kidney disease, or unspecified chronic kidney disease: Secondary | ICD-10-CM | POA: Diagnosis not present

## 2018-04-20 DIAGNOSIS — M47816 Spondylosis without myelopathy or radiculopathy, lumbar region: Secondary | ICD-10-CM | POA: Diagnosis not present

## 2018-04-20 DIAGNOSIS — I5022 Chronic systolic (congestive) heart failure: Secondary | ICD-10-CM | POA: Diagnosis not present

## 2018-04-20 DIAGNOSIS — N184 Chronic kidney disease, stage 4 (severe): Secondary | ICD-10-CM | POA: Diagnosis not present

## 2018-04-20 DIAGNOSIS — F410 Panic disorder [episodic paroxysmal anxiety] without agoraphobia: Secondary | ICD-10-CM | POA: Diagnosis not present

## 2018-04-20 DIAGNOSIS — D631 Anemia in chronic kidney disease: Secondary | ICD-10-CM | POA: Diagnosis not present

## 2018-04-20 DIAGNOSIS — I130 Hypertensive heart and chronic kidney disease with heart failure and stage 1 through stage 4 chronic kidney disease, or unspecified chronic kidney disease: Secondary | ICD-10-CM | POA: Diagnosis not present

## 2018-04-20 DIAGNOSIS — N183 Chronic kidney disease, stage 3 (moderate): Secondary | ICD-10-CM | POA: Diagnosis not present

## 2018-04-20 DIAGNOSIS — E8770 Fluid overload, unspecified: Secondary | ICD-10-CM | POA: Diagnosis not present

## 2018-04-20 DIAGNOSIS — M7501 Adhesive capsulitis of right shoulder: Secondary | ICD-10-CM | POA: Diagnosis not present

## 2018-04-22 DIAGNOSIS — D631 Anemia in chronic kidney disease: Secondary | ICD-10-CM | POA: Diagnosis not present

## 2018-04-22 DIAGNOSIS — N183 Chronic kidney disease, stage 3 (moderate): Secondary | ICD-10-CM | POA: Diagnosis not present

## 2018-04-22 DIAGNOSIS — I130 Hypertensive heart and chronic kidney disease with heart failure and stage 1 through stage 4 chronic kidney disease, or unspecified chronic kidney disease: Secondary | ICD-10-CM | POA: Diagnosis not present

## 2018-04-22 DIAGNOSIS — I5022 Chronic systolic (congestive) heart failure: Secondary | ICD-10-CM | POA: Diagnosis not present

## 2018-04-22 DIAGNOSIS — F410 Panic disorder [episodic paroxysmal anxiety] without agoraphobia: Secondary | ICD-10-CM | POA: Diagnosis not present

## 2018-04-22 DIAGNOSIS — M47816 Spondylosis without myelopathy or radiculopathy, lumbar region: Secondary | ICD-10-CM | POA: Diagnosis not present

## 2018-04-27 DIAGNOSIS — N184 Chronic kidney disease, stage 4 (severe): Secondary | ICD-10-CM | POA: Diagnosis not present

## 2018-05-09 DIAGNOSIS — Z862 Personal history of diseases of the blood and blood-forming organs and certain disorders involving the immune mechanism: Secondary | ICD-10-CM | POA: Diagnosis not present

## 2018-05-09 DIAGNOSIS — R197 Diarrhea, unspecified: Secondary | ICD-10-CM | POA: Diagnosis not present

## 2018-05-10 DIAGNOSIS — K5730 Diverticulosis of large intestine without perforation or abscess without bleeding: Secondary | ICD-10-CM | POA: Diagnosis not present

## 2018-05-10 DIAGNOSIS — Z79899 Other long term (current) drug therapy: Secondary | ICD-10-CM | POA: Diagnosis not present

## 2018-05-10 DIAGNOSIS — Z880 Allergy status to penicillin: Secondary | ICD-10-CM | POA: Diagnosis not present

## 2018-05-10 DIAGNOSIS — Z7982 Long term (current) use of aspirin: Secondary | ICD-10-CM | POA: Diagnosis not present

## 2018-05-10 DIAGNOSIS — R1033 Periumbilical pain: Secondary | ICD-10-CM | POA: Diagnosis not present

## 2018-05-10 DIAGNOSIS — K219 Gastro-esophageal reflux disease without esophagitis: Secondary | ICD-10-CM | POA: Diagnosis not present

## 2018-05-10 DIAGNOSIS — N183 Chronic kidney disease, stage 3 (moderate): Secondary | ICD-10-CM | POA: Diagnosis not present

## 2018-05-10 DIAGNOSIS — I700 Atherosclerosis of aorta: Secondary | ICD-10-CM | POA: Diagnosis not present

## 2018-05-10 DIAGNOSIS — K5790 Diverticulosis of intestine, part unspecified, without perforation or abscess without bleeding: Secondary | ICD-10-CM | POA: Diagnosis not present

## 2018-05-10 DIAGNOSIS — I129 Hypertensive chronic kidney disease with stage 1 through stage 4 chronic kidney disease, or unspecified chronic kidney disease: Secondary | ICD-10-CM | POA: Diagnosis not present

## 2018-05-10 DIAGNOSIS — Z91041 Radiographic dye allergy status: Secondary | ICD-10-CM | POA: Diagnosis not present

## 2018-05-10 DIAGNOSIS — Z888 Allergy status to other drugs, medicaments and biological substances status: Secondary | ICD-10-CM | POA: Diagnosis not present

## 2018-05-10 DIAGNOSIS — Z881 Allergy status to other antibiotic agents status: Secondary | ICD-10-CM | POA: Diagnosis not present

## 2018-05-10 DIAGNOSIS — E039 Hypothyroidism, unspecified: Secondary | ICD-10-CM | POA: Diagnosis not present

## 2018-05-10 DIAGNOSIS — Z882 Allergy status to sulfonamides status: Secondary | ICD-10-CM | POA: Diagnosis not present

## 2018-05-10 DIAGNOSIS — K5792 Diverticulitis of intestine, part unspecified, without perforation or abscess without bleeding: Secondary | ICD-10-CM | POA: Diagnosis not present

## 2018-05-10 DIAGNOSIS — K5732 Diverticulitis of large intestine without perforation or abscess without bleeding: Secondary | ICD-10-CM | POA: Diagnosis not present

## 2018-05-10 DIAGNOSIS — R197 Diarrhea, unspecified: Secondary | ICD-10-CM | POA: Diagnosis not present

## 2018-05-10 DIAGNOSIS — Z885 Allergy status to narcotic agent status: Secondary | ICD-10-CM | POA: Diagnosis not present

## 2018-05-25 DIAGNOSIS — I493 Ventricular premature depolarization: Secondary | ICD-10-CM | POA: Diagnosis not present

## 2018-05-25 DIAGNOSIS — Z7689 Persons encountering health services in other specified circumstances: Secondary | ICD-10-CM | POA: Diagnosis not present

## 2018-05-25 DIAGNOSIS — R109 Unspecified abdominal pain: Secondary | ICD-10-CM | POA: Diagnosis not present

## 2018-05-25 DIAGNOSIS — K862 Cyst of pancreas: Secondary | ICD-10-CM | POA: Diagnosis not present

## 2018-05-25 DIAGNOSIS — N183 Chronic kidney disease, stage 3 (moderate): Secondary | ICD-10-CM | POA: Diagnosis not present

## 2018-05-25 DIAGNOSIS — R1084 Generalized abdominal pain: Secondary | ICD-10-CM | POA: Diagnosis not present

## 2018-05-25 DIAGNOSIS — K828 Other specified diseases of gallbladder: Secondary | ICD-10-CM | POA: Diagnosis not present

## 2018-05-25 DIAGNOSIS — K8020 Calculus of gallbladder without cholecystitis without obstruction: Secondary | ICD-10-CM | POA: Diagnosis not present

## 2018-05-25 DIAGNOSIS — I129 Hypertensive chronic kidney disease with stage 1 through stage 4 chronic kidney disease, or unspecified chronic kidney disease: Secondary | ICD-10-CM | POA: Diagnosis not present

## 2018-05-30 DIAGNOSIS — N184 Chronic kidney disease, stage 4 (severe): Secondary | ICD-10-CM | POA: Diagnosis not present

## 2018-05-30 DIAGNOSIS — E8770 Fluid overload, unspecified: Secondary | ICD-10-CM | POA: Diagnosis not present

## 2018-06-14 DIAGNOSIS — R41841 Cognitive communication deficit: Secondary | ICD-10-CM | POA: Diagnosis not present

## 2018-06-14 DIAGNOSIS — M6281 Muscle weakness (generalized): Secondary | ICD-10-CM | POA: Diagnosis not present

## 2018-06-14 DIAGNOSIS — I10 Essential (primary) hypertension: Secondary | ICD-10-CM | POA: Diagnosis not present

## 2018-06-14 DIAGNOSIS — R5381 Other malaise: Secondary | ICD-10-CM | POA: Diagnosis not present

## 2018-06-15 DIAGNOSIS — M6281 Muscle weakness (generalized): Secondary | ICD-10-CM | POA: Diagnosis not present

## 2018-06-15 DIAGNOSIS — R41841 Cognitive communication deficit: Secondary | ICD-10-CM | POA: Diagnosis not present

## 2018-06-15 DIAGNOSIS — I10 Essential (primary) hypertension: Secondary | ICD-10-CM | POA: Diagnosis not present

## 2018-06-15 DIAGNOSIS — R5381 Other malaise: Secondary | ICD-10-CM | POA: Diagnosis not present

## 2018-06-16 DIAGNOSIS — M6281 Muscle weakness (generalized): Secondary | ICD-10-CM | POA: Diagnosis not present

## 2018-06-16 DIAGNOSIS — I10 Essential (primary) hypertension: Secondary | ICD-10-CM | POA: Diagnosis not present

## 2018-06-16 DIAGNOSIS — R5381 Other malaise: Secondary | ICD-10-CM | POA: Diagnosis not present

## 2018-06-16 DIAGNOSIS — R41841 Cognitive communication deficit: Secondary | ICD-10-CM | POA: Diagnosis not present

## 2018-06-17 DIAGNOSIS — M6281 Muscle weakness (generalized): Secondary | ICD-10-CM | POA: Diagnosis not present

## 2018-06-17 DIAGNOSIS — R5381 Other malaise: Secondary | ICD-10-CM | POA: Diagnosis not present

## 2018-06-17 DIAGNOSIS — I10 Essential (primary) hypertension: Secondary | ICD-10-CM | POA: Diagnosis not present

## 2018-06-17 DIAGNOSIS — R41841 Cognitive communication deficit: Secondary | ICD-10-CM | POA: Diagnosis not present

## 2018-06-18 DIAGNOSIS — M6281 Muscle weakness (generalized): Secondary | ICD-10-CM | POA: Diagnosis not present

## 2018-06-18 DIAGNOSIS — I10 Essential (primary) hypertension: Secondary | ICD-10-CM | POA: Diagnosis not present

## 2018-06-18 DIAGNOSIS — R41841 Cognitive communication deficit: Secondary | ICD-10-CM | POA: Diagnosis not present

## 2018-06-18 DIAGNOSIS — R5381 Other malaise: Secondary | ICD-10-CM | POA: Diagnosis not present

## 2018-06-20 DIAGNOSIS — R41841 Cognitive communication deficit: Secondary | ICD-10-CM | POA: Diagnosis not present

## 2018-06-20 DIAGNOSIS — M6281 Muscle weakness (generalized): Secondary | ICD-10-CM | POA: Diagnosis not present

## 2018-06-20 DIAGNOSIS — I10 Essential (primary) hypertension: Secondary | ICD-10-CM | POA: Diagnosis not present

## 2018-06-20 DIAGNOSIS — R5381 Other malaise: Secondary | ICD-10-CM | POA: Diagnosis not present

## 2018-06-21 DIAGNOSIS — Z79899 Other long term (current) drug therapy: Secondary | ICD-10-CM | POA: Diagnosis not present

## 2018-06-21 DIAGNOSIS — E039 Hypothyroidism, unspecified: Secondary | ICD-10-CM | POA: Diagnosis not present

## 2018-06-21 DIAGNOSIS — R5381 Other malaise: Secondary | ICD-10-CM | POA: Diagnosis not present

## 2018-06-21 DIAGNOSIS — D649 Anemia, unspecified: Secondary | ICD-10-CM | POA: Diagnosis not present

## 2018-06-21 DIAGNOSIS — D518 Other vitamin B12 deficiency anemias: Secondary | ICD-10-CM | POA: Diagnosis not present

## 2018-06-21 DIAGNOSIS — E782 Mixed hyperlipidemia: Secondary | ICD-10-CM | POA: Diagnosis not present

## 2018-06-21 DIAGNOSIS — E7800 Pure hypercholesterolemia, unspecified: Secondary | ICD-10-CM | POA: Diagnosis not present

## 2018-06-21 DIAGNOSIS — E559 Vitamin D deficiency, unspecified: Secondary | ICD-10-CM | POA: Diagnosis not present

## 2018-06-21 DIAGNOSIS — M6281 Muscle weakness (generalized): Secondary | ICD-10-CM | POA: Diagnosis not present

## 2018-06-21 DIAGNOSIS — I10 Essential (primary) hypertension: Secondary | ICD-10-CM | POA: Diagnosis not present

## 2018-06-21 DIAGNOSIS — R41841 Cognitive communication deficit: Secondary | ICD-10-CM | POA: Diagnosis not present

## 2018-06-21 DIAGNOSIS — E119 Type 2 diabetes mellitus without complications: Secondary | ICD-10-CM | POA: Diagnosis not present

## 2018-06-22 DIAGNOSIS — I10 Essential (primary) hypertension: Secondary | ICD-10-CM | POA: Diagnosis not present

## 2018-06-22 DIAGNOSIS — R41841 Cognitive communication deficit: Secondary | ICD-10-CM | POA: Diagnosis not present

## 2018-06-22 DIAGNOSIS — M6281 Muscle weakness (generalized): Secondary | ICD-10-CM | POA: Diagnosis not present

## 2018-06-22 DIAGNOSIS — R5381 Other malaise: Secondary | ICD-10-CM | POA: Diagnosis not present

## 2018-06-23 DIAGNOSIS — R5381 Other malaise: Secondary | ICD-10-CM | POA: Diagnosis not present

## 2018-06-23 DIAGNOSIS — M6281 Muscle weakness (generalized): Secondary | ICD-10-CM | POA: Diagnosis not present

## 2018-06-23 DIAGNOSIS — R41841 Cognitive communication deficit: Secondary | ICD-10-CM | POA: Diagnosis not present

## 2018-06-23 DIAGNOSIS — I10 Essential (primary) hypertension: Secondary | ICD-10-CM | POA: Diagnosis not present

## 2018-06-24 DIAGNOSIS — I10 Essential (primary) hypertension: Secondary | ICD-10-CM | POA: Diagnosis not present

## 2018-06-24 DIAGNOSIS — R41841 Cognitive communication deficit: Secondary | ICD-10-CM | POA: Diagnosis not present

## 2018-06-24 DIAGNOSIS — R5381 Other malaise: Secondary | ICD-10-CM | POA: Diagnosis not present

## 2018-06-24 DIAGNOSIS — M6281 Muscle weakness (generalized): Secondary | ICD-10-CM | POA: Diagnosis not present

## 2018-06-25 DIAGNOSIS — M6281 Muscle weakness (generalized): Secondary | ICD-10-CM | POA: Diagnosis not present

## 2018-06-25 DIAGNOSIS — R5381 Other malaise: Secondary | ICD-10-CM | POA: Diagnosis not present

## 2018-06-25 DIAGNOSIS — I10 Essential (primary) hypertension: Secondary | ICD-10-CM | POA: Diagnosis not present

## 2018-06-25 DIAGNOSIS — R41841 Cognitive communication deficit: Secondary | ICD-10-CM | POA: Diagnosis not present

## 2018-06-26 DIAGNOSIS — R41841 Cognitive communication deficit: Secondary | ICD-10-CM | POA: Diagnosis not present

## 2018-06-26 DIAGNOSIS — R5381 Other malaise: Secondary | ICD-10-CM | POA: Diagnosis not present

## 2018-06-26 DIAGNOSIS — I10 Essential (primary) hypertension: Secondary | ICD-10-CM | POA: Diagnosis not present

## 2018-06-26 DIAGNOSIS — M6281 Muscle weakness (generalized): Secondary | ICD-10-CM | POA: Diagnosis not present

## 2018-06-28 DIAGNOSIS — I10 Essential (primary) hypertension: Secondary | ICD-10-CM | POA: Diagnosis not present

## 2018-06-28 DIAGNOSIS — R41841 Cognitive communication deficit: Secondary | ICD-10-CM | POA: Diagnosis not present

## 2018-06-28 DIAGNOSIS — M6281 Muscle weakness (generalized): Secondary | ICD-10-CM | POA: Diagnosis not present

## 2018-06-28 DIAGNOSIS — R5381 Other malaise: Secondary | ICD-10-CM | POA: Diagnosis not present

## 2018-06-29 DIAGNOSIS — R5381 Other malaise: Secondary | ICD-10-CM | POA: Diagnosis not present

## 2018-06-29 DIAGNOSIS — R41841 Cognitive communication deficit: Secondary | ICD-10-CM | POA: Diagnosis not present

## 2018-06-29 DIAGNOSIS — I10 Essential (primary) hypertension: Secondary | ICD-10-CM | POA: Diagnosis not present

## 2018-06-29 DIAGNOSIS — M6281 Muscle weakness (generalized): Secondary | ICD-10-CM | POA: Diagnosis not present

## 2018-06-30 DIAGNOSIS — I10 Essential (primary) hypertension: Secondary | ICD-10-CM | POA: Diagnosis not present

## 2018-06-30 DIAGNOSIS — R5381 Other malaise: Secondary | ICD-10-CM | POA: Diagnosis not present

## 2018-06-30 DIAGNOSIS — M6281 Muscle weakness (generalized): Secondary | ICD-10-CM | POA: Diagnosis not present

## 2018-06-30 DIAGNOSIS — R41841 Cognitive communication deficit: Secondary | ICD-10-CM | POA: Diagnosis not present

## 2018-07-01 DIAGNOSIS — R41841 Cognitive communication deficit: Secondary | ICD-10-CM | POA: Diagnosis not present

## 2018-07-01 DIAGNOSIS — I10 Essential (primary) hypertension: Secondary | ICD-10-CM | POA: Diagnosis not present

## 2018-07-01 DIAGNOSIS — M6281 Muscle weakness (generalized): Secondary | ICD-10-CM | POA: Diagnosis not present

## 2018-07-01 DIAGNOSIS — R5381 Other malaise: Secondary | ICD-10-CM | POA: Diagnosis not present

## 2018-07-02 DIAGNOSIS — M6281 Muscle weakness (generalized): Secondary | ICD-10-CM | POA: Diagnosis not present

## 2018-07-02 DIAGNOSIS — I10 Essential (primary) hypertension: Secondary | ICD-10-CM | POA: Diagnosis not present

## 2018-07-02 DIAGNOSIS — R5381 Other malaise: Secondary | ICD-10-CM | POA: Diagnosis not present

## 2018-07-02 DIAGNOSIS — R41841 Cognitive communication deficit: Secondary | ICD-10-CM | POA: Diagnosis not present

## 2018-07-04 DIAGNOSIS — I10 Essential (primary) hypertension: Secondary | ICD-10-CM | POA: Diagnosis not present

## 2018-07-04 DIAGNOSIS — R41841 Cognitive communication deficit: Secondary | ICD-10-CM | POA: Diagnosis not present

## 2018-07-04 DIAGNOSIS — M6281 Muscle weakness (generalized): Secondary | ICD-10-CM | POA: Diagnosis not present

## 2018-07-04 DIAGNOSIS — R5381 Other malaise: Secondary | ICD-10-CM | POA: Diagnosis not present

## 2018-07-05 DIAGNOSIS — M6281 Muscle weakness (generalized): Secondary | ICD-10-CM | POA: Diagnosis not present

## 2018-07-05 DIAGNOSIS — R5381 Other malaise: Secondary | ICD-10-CM | POA: Diagnosis not present

## 2018-07-05 DIAGNOSIS — I10 Essential (primary) hypertension: Secondary | ICD-10-CM | POA: Diagnosis not present

## 2018-07-05 DIAGNOSIS — D649 Anemia, unspecified: Secondary | ICD-10-CM | POA: Diagnosis not present

## 2018-07-05 DIAGNOSIS — R41841 Cognitive communication deficit: Secondary | ICD-10-CM | POA: Diagnosis not present

## 2018-07-05 DIAGNOSIS — Z79899 Other long term (current) drug therapy: Secondary | ICD-10-CM | POA: Diagnosis not present

## 2018-07-06 DIAGNOSIS — R5381 Other malaise: Secondary | ICD-10-CM | POA: Diagnosis not present

## 2018-07-06 DIAGNOSIS — R41841 Cognitive communication deficit: Secondary | ICD-10-CM | POA: Diagnosis not present

## 2018-07-06 DIAGNOSIS — M6281 Muscle weakness (generalized): Secondary | ICD-10-CM | POA: Diagnosis not present

## 2018-07-06 DIAGNOSIS — I10 Essential (primary) hypertension: Secondary | ICD-10-CM | POA: Diagnosis not present

## 2018-07-07 DIAGNOSIS — I10 Essential (primary) hypertension: Secondary | ICD-10-CM | POA: Diagnosis not present

## 2018-07-07 DIAGNOSIS — R41841 Cognitive communication deficit: Secondary | ICD-10-CM | POA: Diagnosis not present

## 2018-07-07 DIAGNOSIS — R109 Unspecified abdominal pain: Secondary | ICD-10-CM | POA: Diagnosis not present

## 2018-07-07 DIAGNOSIS — R5381 Other malaise: Secondary | ICD-10-CM | POA: Diagnosis not present

## 2018-07-07 DIAGNOSIS — M6281 Muscle weakness (generalized): Secondary | ICD-10-CM | POA: Diagnosis not present

## 2018-07-08 DIAGNOSIS — M6281 Muscle weakness (generalized): Secondary | ICD-10-CM | POA: Diagnosis not present

## 2018-07-08 DIAGNOSIS — I10 Essential (primary) hypertension: Secondary | ICD-10-CM | POA: Diagnosis not present

## 2018-07-08 DIAGNOSIS — R5381 Other malaise: Secondary | ICD-10-CM | POA: Diagnosis not present

## 2018-07-08 DIAGNOSIS — R41841 Cognitive communication deficit: Secondary | ICD-10-CM | POA: Diagnosis not present

## 2018-07-13 DIAGNOSIS — N390 Urinary tract infection, site not specified: Secondary | ICD-10-CM | POA: Diagnosis not present

## 2018-07-13 DIAGNOSIS — Z79899 Other long term (current) drug therapy: Secondary | ICD-10-CM | POA: Diagnosis not present

## 2018-07-15 DIAGNOSIS — D518 Other vitamin B12 deficiency anemias: Secondary | ICD-10-CM | POA: Diagnosis not present

## 2018-07-15 DIAGNOSIS — Z79899 Other long term (current) drug therapy: Secondary | ICD-10-CM | POA: Diagnosis not present

## 2018-07-20 DIAGNOSIS — D649 Anemia, unspecified: Secondary | ICD-10-CM | POA: Diagnosis not present

## 2018-08-31 DIAGNOSIS — Z79899 Other long term (current) drug therapy: Secondary | ICD-10-CM | POA: Diagnosis not present

## 2018-09-05 DIAGNOSIS — D649 Anemia, unspecified: Secondary | ICD-10-CM | POA: Diagnosis not present

## 2018-09-05 DIAGNOSIS — Z79899 Other long term (current) drug therapy: Secondary | ICD-10-CM | POA: Diagnosis not present

## 2018-09-08 DIAGNOSIS — D649 Anemia, unspecified: Secondary | ICD-10-CM | POA: Diagnosis not present

## 2018-10-16 ENCOUNTER — Emergency Department (HOSPITAL_COMMUNITY): Payer: Medicare Other

## 2018-10-16 ENCOUNTER — Observation Stay (HOSPITAL_COMMUNITY)
Admission: EM | Admit: 2018-10-16 | Discharge: 2018-10-17 | Disposition: A | Discharge status: Home / Self Care | Payer: Medicare Other | Attending: Internal Medicine | Admitting: Internal Medicine

## 2018-10-16 ENCOUNTER — Other Ambulatory Visit: Payer: Self-pay

## 2018-10-16 ENCOUNTER — Encounter (HOSPITAL_COMMUNITY): Payer: Self-pay

## 2018-10-16 DIAGNOSIS — N183 Chronic kidney disease, stage 3 unspecified: Secondary | ICD-10-CM | POA: Diagnosis present

## 2018-10-16 DIAGNOSIS — Z853 Personal history of malignant neoplasm of breast: Secondary | ICD-10-CM | POA: Insufficient documentation

## 2018-10-16 DIAGNOSIS — I129 Hypertensive chronic kidney disease with stage 1 through stage 4 chronic kidney disease, or unspecified chronic kidney disease: Secondary | ICD-10-CM | POA: Insufficient documentation

## 2018-10-16 DIAGNOSIS — R0789 Other chest pain: Secondary | ICD-10-CM | POA: Diagnosis not present

## 2018-10-16 DIAGNOSIS — R0781 Pleurodynia: Secondary | ICD-10-CM | POA: Diagnosis present

## 2018-10-16 DIAGNOSIS — N289 Disorder of kidney and ureter, unspecified: Secondary | ICD-10-CM

## 2018-10-16 DIAGNOSIS — Z79899 Other long term (current) drug therapy: Secondary | ICD-10-CM | POA: Insufficient documentation

## 2018-10-16 DIAGNOSIS — R109 Unspecified abdominal pain: Secondary | ICD-10-CM

## 2018-10-16 DIAGNOSIS — I10 Essential (primary) hypertension: Secondary | ICD-10-CM | POA: Diagnosis present

## 2018-10-16 HISTORY — DX: Disorder of kidney and ureter, unspecified: N28.9

## 2018-10-16 HISTORY — DX: Claustrophobia: F40.240

## 2018-10-16 HISTORY — DX: Malignant (primary) neoplasm, unspecified: C80.1

## 2018-10-16 HISTORY — DX: Essential (primary) hypertension: I10

## 2018-10-16 LAB — URINALYSIS, ROUTINE W REFLEX MICROSCOPIC
Bilirubin Urine: NEGATIVE
GLUCOSE, UA: NEGATIVE mg/dL
HGB URINE DIPSTICK: NEGATIVE
Ketones, ur: NEGATIVE mg/dL
Leukocytes, UA: NEGATIVE
Nitrite: NEGATIVE
PROTEIN: NEGATIVE mg/dL
Specific Gravity, Urine: 1.004 — ABNORMAL LOW (ref 1.005–1.030)
pH: 6 (ref 5.0–8.0)

## 2018-10-16 LAB — COMPREHENSIVE METABOLIC PANEL
ALT: 19 U/L (ref 0–44)
ANION GAP: 11 (ref 5–15)
AST: 19 U/L (ref 15–41)
Albumin: 2.7 g/dL — ABNORMAL LOW (ref 3.5–5.0)
Alkaline Phosphatase: 102 U/L (ref 38–126)
BUN: 40 mg/dL — ABNORMAL HIGH (ref 8–23)
CO2: 25 mmol/L (ref 22–32)
Calcium: 8.3 mg/dL — ABNORMAL LOW (ref 8.9–10.3)
Chloride: 97 mmol/L — ABNORMAL LOW (ref 98–111)
Creatinine, Ser: 1.42 mg/dL — ABNORMAL HIGH (ref 0.44–1.00)
GFR calc Af Amer: 36 mL/min — ABNORMAL LOW (ref >60)
GFR calc non Af Amer: 31 mL/min — ABNORMAL LOW (ref >60)
Glucose, Bld: 136 mg/dL — ABNORMAL HIGH (ref 70–99)
Potassium: 3.3 mmol/L — ABNORMAL LOW (ref 3.5–5.1)
Sodium: 133 mmol/L — ABNORMAL LOW (ref 135–145)
Total Bilirubin: 0.8 mg/dL (ref 0.3–1.2)
Total Protein: 7 g/dL (ref 6.5–8.1)

## 2018-10-16 LAB — CBC WITH DIFFERENTIAL/PLATELET
Abs Immature Granulocytes: 0.06 10*3/uL (ref 0.00–0.07)
Basophils Absolute: 0 10*3/uL (ref 0.0–0.1)
Basophils Relative: 0 %
EOS ABS: 0 10*3/uL (ref 0.0–0.5)
EOS PCT: 0 %
HCT: 31.5 % — ABNORMAL LOW (ref 36.0–46.0)
Hemoglobin: 9.9 g/dL — ABNORMAL LOW (ref 12.0–15.0)
Immature Granulocytes: 1 %
LYMPHS PCT: 10 %
Lymphs Abs: 0.9 10*3/uL (ref 0.7–4.0)
MCH: 27.8 pg (ref 26.0–34.0)
MCHC: 31.4 g/dL (ref 30.0–36.0)
MCV: 88.5 fL (ref 80.0–100.0)
Monocytes Absolute: 0.9 10*3/uL (ref 0.1–1.0)
Monocytes Relative: 10 %
Neutro Abs: 7 10*3/uL (ref 1.7–7.7)
Neutrophils Relative %: 79 %
Platelets: 331 10*3/uL (ref 150–400)
RBC: 3.56 MIL/uL — ABNORMAL LOW (ref 3.87–5.11)
RDW: 16.4 % — ABNORMAL HIGH (ref 11.5–15.5)
WBC: 8.9 10*3/uL (ref 4.0–10.5)
nRBC: 0 % (ref 0.0–0.2)

## 2018-10-16 LAB — LIPASE, BLOOD: Lipase: 37 U/L (ref 11–51)

## 2018-10-16 LAB — I-STAT TROPONIN, ED: Troponin i, poc: 0 ng/mL (ref 0.00–0.08)

## 2018-10-16 LAB — D-DIMER, QUANTITATIVE: D-Dimer, Quant: 7.28 ug/mL-FEU — ABNORMAL HIGH (ref 0.00–0.50)

## 2018-10-16 NOTE — ED Triage Notes (Signed)
Pt arrives via [facility] from [facility] c/o right side flank pain since 1100. Pt reports pain comes and goes and is worse with movement. Denies N/V/D or SOB. Pt A+OX4.

## 2018-10-17 ENCOUNTER — Observation Stay (HOSPITAL_COMMUNITY): Payer: Medicare Other

## 2018-10-17 ENCOUNTER — Encounter (HOSPITAL_COMMUNITY): Payer: Self-pay | Admitting: Internal Medicine

## 2018-10-17 DIAGNOSIS — N289 Disorder of kidney and ureter, unspecified: Secondary | ICD-10-CM | POA: Diagnosis not present

## 2018-10-17 DIAGNOSIS — R0781 Pleurodynia: Secondary | ICD-10-CM | POA: Diagnosis present

## 2018-10-17 DIAGNOSIS — N183 Chronic kidney disease, stage 3 unspecified: Secondary | ICD-10-CM | POA: Diagnosis present

## 2018-10-17 DIAGNOSIS — I10 Essential (primary) hypertension: Secondary | ICD-10-CM | POA: Diagnosis present

## 2018-10-17 DIAGNOSIS — R0789 Other chest pain: Secondary | ICD-10-CM | POA: Diagnosis not present

## 2018-10-17 LAB — TROPONIN I
Troponin I: <0.03 ng/mL (ref <0.03)
Troponin I: <0.03 ng/mL (ref <0.03)
Troponin I: <0.03 ng/mL (ref <0.03)

## 2018-10-17 LAB — APTT: aPTT: 32 seconds (ref 24–36)

## 2018-10-17 LAB — PROTIME-INR
INR: 1.07
Prothrombin Time: 13.9 seconds (ref 11.4–15.2)

## 2018-10-17 LAB — HEPARIN LEVEL (UNFRACTIONATED): Heparin Unfractionated: <0.1 IU/mL — ABNORMAL LOW (ref 0.30–0.70)

## 2018-10-17 MED ORDER — POTASSIUM CHLORIDE CRYS ER 20 MEQ PO TBCR
20.0000 meq | EXTENDED_RELEASE_TABLET | Freq: Every morning | ORAL | Status: DC
  Administered 2018-10-17: 20 meq via ORAL
  Filled 2018-10-17 (×2): qty 1

## 2018-10-17 MED ORDER — PANTOPRAZOLE SODIUM 40 MG PO TBEC
40.0000 mg | DELAYED_RELEASE_TABLET | Freq: Every day | ORAL | Status: DC
  Administered 2018-10-17: 40 mg via ORAL
  Filled 2018-10-17: qty 1

## 2018-10-17 MED ORDER — HEPARIN (PORCINE) 25000 UT/250ML-% IV SOLN
1050.0000 [IU]/h | INTRAVENOUS | Status: DC
  Administered 2018-10-17: 1050 [IU]/h via INTRAVENOUS
  Filled 2018-10-17: qty 250

## 2018-10-17 MED ORDER — TRAMADOL HCL 50 MG PO TABS
50.0000 mg | ORAL_TABLET | Freq: Every day | ORAL | Status: DC | PRN
  Administered 2018-10-17 (×2): 50 mg via ORAL
  Filled 2018-10-17 (×2): qty 1

## 2018-10-17 MED ORDER — ACETAMINOPHEN 650 MG RE SUPP: 650.0000 mg | Freq: Four times a day (QID) | RECTAL | Status: DC | PRN

## 2018-10-17 MED ORDER — TECHNETIUM TO 99M ALBUMIN AGGREGATED
4.0000 | Freq: Once | INTRAVENOUS | Status: AC | PRN
  Administered 2018-10-17: 4.4 via INTRAVENOUS

## 2018-10-17 MED ORDER — NIFEDIPINE ER OSMOTIC RELEASE 30 MG PO TB24
60.0000 mg | ORAL_TABLET | Freq: Every day | ORAL | Status: DC
  Administered 2018-10-17: 60 mg via ORAL
  Filled 2018-10-17: qty 2

## 2018-10-17 MED ORDER — METOPROLOL TARTRATE 50 MG PO TABS: 50.0000 mg | ORAL_TABLET | Freq: Two times a day (BID) | ORAL | Status: DC

## 2018-10-17 MED ORDER — DICLOFENAC SODIUM 1 % TD GEL
1.0000 "application " | Freq: Four times a day (QID) | TRANSDERMAL | Status: DC | PRN
  Filled 2018-10-17: qty 100

## 2018-10-17 MED ORDER — METOPROLOL TARTRATE 50 MG PO TABS
50.0000 mg | ORAL_TABLET | Freq: Two times a day (BID) | ORAL | Status: DC
  Administered 2018-10-17: 50 mg via ORAL
  Filled 2018-10-17: qty 1

## 2018-10-17 MED ORDER — CYCLOSPORINE 0.05 % OP EMUL
1.0000 [drp] | Freq: Every morning | OPHTHALMIC | Status: DC
  Administered 2018-10-17: 1 [drp] via OPHTHALMIC
  Filled 2018-10-17 (×4): qty 1

## 2018-10-17 MED ORDER — ACETAMINOPHEN 325 MG PO TABS: 650.0000 mg | ORAL_TABLET | Freq: Four times a day (QID) | ORAL | Status: DC | PRN

## 2018-10-17 MED ORDER — DOCUSATE SODIUM 100 MG PO CAPS
100.0000 mg | ORAL_CAPSULE | Freq: Every day | ORAL | Status: DC
  Administered 2018-10-17: 100 mg via ORAL
  Filled 2018-10-17: qty 1

## 2018-10-17 MED ORDER — FUROSEMIDE 80 MG PO TABS
160.0000 mg | ORAL_TABLET | Freq: Every day | ORAL | Status: DC
  Administered 2018-10-17: 160 mg via ORAL
  Filled 2018-10-17: qty 2

## 2018-10-17 MED ORDER — FUROSEMIDE 80 MG PO TABS: 80.0000 mg | ORAL_TABLET | Freq: Every day | ORAL | Status: DC

## 2018-10-17 MED ORDER — FUROSEMIDE 40 MG PO TABS: 80.0000 mg | ORAL_TABLET | Freq: Every day | ORAL | Status: DC

## 2018-10-17 MED ORDER — ONDANSETRON HCL 4 MG PO TABS: 4.0000 mg | ORAL_TABLET | Freq: Four times a day (QID) | ORAL | Status: DC | PRN

## 2018-10-17 MED ORDER — VITAMIN D 25 MCG (1000 UNIT) PO TABS
1000.0000 [IU] | ORAL_TABLET | Freq: Every morning | ORAL | Status: DC
  Administered 2018-10-17: 1000 [IU] via ORAL
  Filled 2018-10-17 (×4): qty 1

## 2018-10-17 MED ORDER — HEPARIN BOLUS VIA INFUSION
3500.0000 [IU] | Freq: Once | INTRAVENOUS | Status: AC
  Administered 2018-10-17: 3500 [IU] via INTRAVENOUS

## 2018-10-17 MED ORDER — LEVOTHYROXINE SODIUM 75 MCG PO TABS
75.0000 ug | ORAL_TABLET | Freq: Every day | ORAL | Status: DC
  Administered 2018-10-17: 75 ug via ORAL
  Filled 2018-10-17: qty 2

## 2018-10-17 MED ORDER — ACETAMINOPHEN 325 MG PO TABS
650.0000 mg | ORAL_TABLET | Freq: Two times a day (BID) | ORAL | Status: DC
  Administered 2018-10-17: 650 mg via ORAL
  Filled 2018-10-17: qty 2

## 2018-10-17 MED ORDER — ONDANSETRON HCL 4 MG/2ML IJ SOLN: 4.0000 mg | Freq: Four times a day (QID) | INTRAMUSCULAR | Status: DC | PRN

## 2018-10-17 MED ORDER — MAGNESIUM HYDROXIDE 400 MG/5ML PO SUSP: 30.0000 mL | Freq: Every day | ORAL | Status: DC | PRN

## 2018-10-17 MED ORDER — FUROSEMIDE 40 MG PO TABS: 80.0000 mg | ORAL_TABLET | ORAL | Status: DC

## 2018-10-17 NOTE — ED Provider Notes (Addendum)
[facility] EMERGENCY DEPARTMENT Provider Note   CSN: [id number] Arrival date & time: 10/16/18  2153     History   Chief Complaint Chief Complaint  Patient presents with  . Flank Pain    HPI Jaclyn Long is a 84 y.o. female.  Patient is from [place] home.  Patient with a complaint of right sided flank pain but the pain really is over the right lower lateral ribs.  No pain down into the abdomen area or into the groin area.  Pain started at 11:00 this morning.  Comes and goes is worse with movement worse with taking a deep breath.  Patient denies any nausea vomiting or diarrhea or any shortness of breath.  No substernal chest pain.  Patient has a history of removal of her right kidney.  Patient's oxygen saturations on presentation here was 94%.  Patient was hypertensive.  Was not tachycardic afebrile.  Patient is a DNR.     Past Medical History:  Diagnosis Date  . Cancer ([place])    right side breast cancer  . Claustrophobia   . Hypertension   . Renal disorder    right nephrectomy    There are no active problems to display for this patient.     OB History    Gravida  5   Para      Term      Preterm      AB      Living  5     SAB      TAB      Ectopic      Multiple      Live Births               Home Medications    Prior to Admission medications   Medication Sig Start Date End Date Taking? Authorizing Provider  acetaminophen (TYLENOL) 325 MG tablet Take 650 mg by mouth 2 (two) times daily.   Yes [provider]  Cholecalciferol (VITAMIN D) 50 MCG (2000 UT) CAPS Take 1 capsule by mouth every morning.   Yes [provider]  cycloSPORINE (RESTASIS) 0.05 % ophthalmic emulsion Place 1 drop into both eyes every morning.   Yes [provider]  diclofenac sodium (VOLTAREN) 1 % GEL Apply 1 application topically every 6 (six) hours as needed (for anti-inflammatory).   Yes [provider]  docusate sodium  (COLACE) 100 MG capsule Take 100 mg by mouth daily.   Yes [provider]  furosemide (LASIX) 80 MG tablet Take 80-160 mg by mouth See admin instructions. 160mg  in the morning and 80mg  in the afternoon   Yes [provider]  levothyroxine (SYNTHROID, LEVOTHROID) 75 MCG tablet Take 75 mcg by mouth daily before breakfast.   Yes [provider]  magnesium hydroxide (MILK OF MAGNESIA) 400 MG/5ML suspension Take 30 mLs by mouth daily as needed for mild constipation or moderate constipation.   Yes [provider]  metoprolol tartrate (LOPRESSOR) 50 MG tablet Take 50 mg by mouth 2 (two) times daily.   Yes [provider]  NIFEdipine (PROCARDIA XL/NIFEDICAL XL) 60 MG 24 hr tablet Take 60 mg by mouth daily.   Yes [provider]  omeprazole (PRILOSEC) 20 MG capsule Take 20 mg by mouth at bedtime.   Yes [provider]  potassium chloride SA (K-DUR,KLOR-CON) 20 MEQ tablet Take 20 mEq by mouth every morning.   Yes [provider]  traMADol (ULTRAM) 50 MG tablet Take 50 mg by  mouth daily as needed for moderate pain or severe pain.   Yes [provider]    Family History No family history on file.  Social History Social History   Tobacco Use  . Smoking status: Never Smoker  . Smokeless tobacco: Never Used  Substance Use Topics  . Alcohol use: Never    Frequency: Never  . Drug use: Never     Allergies   Ciprofloxacin; Ivp dye [iodinated diagnostic agents]; Penicillins; and Sulfa antibiotics   Review of Systems Review of Systems  Constitutional: Negative for fever.  HENT: Negative for congestion.   Eyes: Negative for redness.  Respiratory: Positive for shortness of breath.   Cardiovascular: Positive for chest pain.  Gastrointestinal: Negative for abdominal pain, diarrhea, nausea and vomiting.  Genitourinary: Negative for dysuria and hematuria.  Musculoskeletal: Negative for back pain.  Skin: Negative for rash.    Neurological: Negative for headaches.  Hematological: Does not bruise/bleed easily.  Psychiatric/Behavioral: Negative for confusion.     Physical Exam Updated Vital Signs BP (!) 186/63 (BP Location: Right Arm)   Pulse 78   Temp 98.1 F (36.7 C) (Oral)   Resp 20   Ht 1.575 m (5\' 2" )   Wt 68 kg   SpO2 94%   BMI 27.44 kg/m   Physical Exam  Constitutional: She is oriented to person, place, and time. She appears well-developed and well-nourished. No distress.  HENT:  Head: Normocephalic and atraumatic.  Mouth/Throat: Oropharynx is clear and moist.  Eyes: Pupils are equal, round, and reactive to light. Conjunctivae and EOM are normal.  Neck: Neck supple.  Cardiovascular: Normal rate, regular rhythm and normal heart sounds.  Pulmonary/Chest: Effort normal and breath sounds normal. No respiratory distress. She has no wheezes. She exhibits tenderness.  Reproducible tenderness to palpation to the right anterior and lateral rib area.  At the lower rib area.  Abdominal: Soft. Bowel sounds are normal. There is no tenderness.  No tenderness in the right upper quadrant or right flank or right CVA area.  Neurological: She is alert and oriented to person, place, and time. No cranial nerve deficit or sensory deficit. She exhibits normal muscle tone. Coordination normal.  Skin: Skin is warm. Capillary refill takes less than 2 seconds.  Nursing note and vitals reviewed.    ED Treatments / Results  Labs (all labs ordered are listed, but only abnormal results are displayed) Labs Reviewed  D-DIMER, QUANTITATIVE (NOT AT [facility]) - Abnormal; Notable for the following components:      Result Value   D-Dimer, Quant 7.28 (*)    All other components within normal limits  COMPREHENSIVE METABOLIC PANEL - Abnormal; Notable for the following components:   Sodium 133 (*)    Potassium 3.3 (*)    Chloride 97 (*)    Glucose, Bld 136 (*)    BUN 40 (*)    Creatinine, Ser 1.42 (*)    Calcium 8.3 (*)     Albumin 2.7 (*)    GFR calc non Af Amer 31 (*)    GFR calc Af Amer 36 (*)    All other components within normal limits  CBC WITH DIFFERENTIAL/PLATELET - Abnormal; Notable for the following components:   RBC 3.56 (*)    Hemoglobin 9.9 (*)    HCT 31.5 (*)    RDW 16.4 (*)    All other components within normal limits  URINALYSIS, ROUTINE W REFLEX MICROSCOPIC - Abnormal; Notable for the following components:   Color, Urine STRAW (*)  Specific Gravity, Urine 1.004 (*)    All other components within normal limits  LIPASE, BLOOD  I-STAT TROPONIN, ED    EKG EKG Interpretation  Date/Time:  Sunday October 16 2018 22:30:12 EST Ventricular Rate:  78 PR Interval:    QRS Duration: 117 QT Interval:  396 QTC Calculation: 452 R Axis:   -36 Text Interpretation:  Sinus rhythm Incomplete left bundle branch block LVH with secondary repolarization abnormality Anterior Q waves, possibly due to LVH No previous ECGs available Confirmed by Fredia Sorrow [phone]) on 10/16/2018 10:53:35 PM   Radiology Dg Ribs Unilateral W/chest Right  Result Date: 10/16/2018 CLINICAL DATA:  Right flank pain. EXAM: RIGHT RIBS AND CHEST - 3+ VIEW COMPARISON:  None. FINDINGS: Radio-opaque marker has been placed on the skin at the site of patient concern. The cardio pericardial silhouette is enlarged. Interstitial markings are diffusely coarsened with chronic features. There is pulmonary vascular congestion without overt pulmonary edema. Bones are diffusely demineralized. Oblique views of the right ribs show no evidence for an acute displaced right-sided rib fracture. IMPRESSION: No acute displaced right-sided rib fracture evident. Osteopenia hinders assessment for nondisplaced fracture. Diffuse chronic underlying interstitial changes with a peripheral and basilar predominance. Features suggest fibrotic lung disease. Cardiomegaly Electronically Signed   By: Misty Stanley M.D.   On: 10/16/2018 23:19    Procedures Procedures  (including critical care time)  CRITICAL CARE Performed by: Fredia Sorrow Total critical care time: 30 minutes Critical care time was exclusive of separately billable procedures and treating other patients. Critical care was necessary to treat or prevent imminent or life-threatening deterioration. Critical care was time spent personally by me on the following activities: development of treatment plan with patient and/or surrogate as well as nursing, discussions with consultants, evaluation of patient's response to treatment, examination of patient, obtaining history from patient or surrogate, ordering and performing treatments and interventions, ordering and review of laboratory studies, ordering and review of radiographic studies, pulse oximetry and re-evaluation of patient's condition.   Medications Ordered in ED Medications - No data to display   Initial Impression / Assessment and Plan / ED Course  I have reviewed the triage vital signs and the nursing notes.  Pertinent labs & imaging results that were available during my care of the patient were reviewed by me and considered in my medical decision making (see chart for details).     Patient with reproducible chest wall tenderness.  No history of any fall.  X-ray of the chest and ribs without any definitive abnormalities.  Patient's pain very pleuritic in nature.  Patient d-dimer markedly elevated.  Troponin normal.  EKG without significant abnormalities.  There is evidence of an incomplete left bundle branch block.  Patient's pain though is very chest wall and pleuritic in nature.  With the marked elevation in the d-dimer pulmonary embolism high suspicion although patient clinically stable.  Patient has dye allergy also had a right kidney removed so her renal function is very marginal with a GFR of 31.  Will discuss with hospitalist patient could receive the allergy protocol and have CT angios or perhaps they will just want to do a VQ  scan.  We will also discussed with the hospital initiating anticoagulation therapy.  She did have a CT renal ordered but without the kidney on that side and really no abdominal tenderness and with the findings of the d-dimer unlikely that there is any significant findings there.  Patient's liver function test without significant abnormalities.   If  discussion with hospitalist we will start her on heparin.  And the plan to do a VQ scan in the morning.  Final Clinical Impressions(s) / ED Diagnoses   Final diagnoses:  Renal insufficiency  Right flank pain  Right-sided chest wall pain    ED Discharge Orders    None       Fredia Sorrow, MD 10/17/18 0006    Fredia Sorrow, MD 10/17/18 9935    Fredia Sorrow, MD 10/17/18 [phone]

## 2018-10-17 NOTE — Clinical Social Work Note (Addendum)
Patient is a long term resident from [facility]. She was in observation for 12 hours. Patient will return to the facility at discharge.  Lynnae Sandhoff at [facility] advised of discharge. No FL2 completed as patient in observation for less than 24 hours.  Message left for daughter advising of discharge.    Rozelle Caudle, Clydene Pugh, LCSW

## 2018-10-17 NOTE — Progress Notes (Signed)
ANTICOAGULATION CONSULT NOTE - Preliminary  Pharmacy Consult for heparin Indication: pulmonary embolus  Allergies  Allergen Reactions  . Ciprofloxacin     Unknown reaction-Listed on MAR  . Ivp Dye [Iodinated Diagnostic Agents]     Unknown reaction-Listed on MAR  . Penicillins     Has patient had a PCN reaction causing immediate rash, facial/tongue/throat swelling, SOB or lightheadedness with hypotension: Unknown Has patient had a PCN reaction causing severe rash involving mucus membranes or skin necrosis: Unknown Has patient had a PCN reaction that required hospitalization: Unknown Has patient had a PCN reaction occurring within the last 10 years: Unknown If all of the above answers are "NO", then may proceed with Cephalosporin use.   . Sulfa Antibiotics     Unknown reaction-Listed on [facility]    Patient Measurements: Height: 5\' 2"  (157.5 cm) Weight: 150 lb (68 kg) IBW/kg (Calculated) : 50.1 HEPARIN DW (KG): 64.2   Vital Signs: Temp: 98.1 F (36.7 C) (12/08 2233) Temp Source: Oral (12/08 2233) BP: 157/60 (12/09 0015) Pulse Rate: 79 (12/09 0015)  Labs: Recent Labs    10/16/18 2250  HGB 9.9*  HCT 31.5*  PLT 331  CREATININE 1.42*  TROPONINI <0.03   Estimated Creatinine Clearance: 20.5 mL/min (A) (by C-G formula based on SCr of 1.42 mg/dL (H)).  Medical History: Past Medical History:  Diagnosis Date  . Cancer ([place])    right side breast cancer  . Claustrophobia   . Hypertension   . Renal disorder    right nephrectomy    Medications:  Scheduled:  . acetaminophen  650 mg Oral BID  . cholecalciferol  1,000 Units Oral q morning - 10a  . cycloSPORINE  1 drop Both Eyes q morning - 10a  . docusate sodium  100 mg Oral Daily  . furosemide  160 mg Oral Daily  . furosemide  80 mg Oral Daily  . levothyroxine  75 mcg Oral QAC breakfast  . metoprolol tartrate  50 mg Oral BID  . NIFEdipine  60 mg Oral Daily  . pantoprazole  40 mg Oral Daily  . potassium chloride SA  20  mEq Oral q morning - 10a   Infusions:  . heparin 1,050 Units/hr (10/17/18 0117)   PRN: acetaminophen **OR** acetaminophen, diclofenac sodium, magnesium hydroxide, ondansetron **OR** ondansetron (ZOFRAN) IV, traMADol Anti-infectives (From admission, onward)   None      Assessment: 84 yo female presented to ED with pleuritic chest pain.  D. Dimer elevated, VQ scan planned for the AM.  Starting heparin gtt for presumed PE.   Goal of Therapy:  Heparin level 0.3-0.7 units/ml   Plan:  Give 3500 units bolus x 1 Start heparin infusion at 1050 units/hr Check anti-Xa level in 8 hours and daily while on heparin Continue to monitor H&H and platelets Preliminary review of pertinent patient information completed.  Forestine Na clinical pharmacist will complete review during morning rounds to assess the patient and finalize treatment regimen.  Alwin Lanigan Scarlett, RPH 10/17/2018,12:48 AM

## 2018-10-17 NOTE — ED Notes (Signed)
Attempted report 

## 2018-10-17 NOTE — Discharge Instructions (Signed)

## 2018-10-17 NOTE — H&P (Signed)
History and Physical    Jaclyn Long XNA:[id number] DOB: [birth year] DOA: 10/16/2018  PCP: Hilbert Corrigan, MD  Patient coming from: SNF  I have personally briefly reviewed patient's old medical records in [place]  Chief Complaint: CP  HPI: Jaclyn Long is a 84 y.o. female with medical history significant of R sided BRCA, R nephrectomy with resulting CKD, HTN.  Patient presents to the ED with c/o R lower rib pain.  Lateral.  Sharp.  Worse with deep breaths or movement or palpation of chest wall.   ED Course: No tachycardia, BP running 542H-062B systolic.  O2 sat 94% on RA, no resp distress.  Trop 0.07 x2.  D.Dimer is 7.28.  Patient has allergy to IV contrast dye and GFR is 31 with creat 1.42.   Review of Systems: As per HPI otherwise 10 point review of systems negative.   Past Medical History:  Diagnosis Date  . Cancer ([place])    right side breast cancer  . Claustrophobia   . Hypertension   . Renal disorder    right nephrectomy    Past Surgical History:  Procedure Laterality Date  . NEPHRECTOMY     right nephrectomy     reports that she has never smoked. She has never used smokeless tobacco. She reports that she does not drink alcohol or use drugs.  Allergies  Allergen Reactions  . Ciprofloxacin     Unknown reaction-Listed on MAR  . Ivp Dye [Iodinated Diagnostic Agents]     Unknown reaction-Listed on MAR  . Penicillins     Has patient had a PCN reaction causing immediate rash, facial/tongue/throat swelling, SOB or lightheadedness with hypotension: Unknown Has patient had a PCN reaction causing severe rash involving mucus membranes or skin necrosis: Unknown Has patient had a PCN reaction that required hospitalization: Unknown Has patient had a PCN reaction occurring within the last 10 years: Unknown If all of the above answers are "NO", then may proceed with Cephalosporin use.   . Sulfa Antibiotics     Unknown reaction-Listed on MAR     History reviewed. No pertinent family history. Non-contributory.  Prior to Admission medications   Medication Sig Start Date End Date Taking? Authorizing Provider  acetaminophen (TYLENOL) 325 MG tablet Take 650 mg by mouth 2 (two) times daily.   Yes [provider]  Cholecalciferol (VITAMIN D) 50 MCG (2000 UT) CAPS Take 1 capsule by mouth every morning.   Yes [provider]  cycloSPORINE (RESTASIS) 0.05 % ophthalmic emulsion Place 1 drop into both eyes every morning.   Yes [provider]  diclofenac sodium (VOLTAREN) 1 % GEL Apply 1 application topically every 6 (six) hours as needed (for anti-inflammatory).   Yes [provider]  docusate sodium (COLACE) 100 MG capsule Take 100 mg by mouth daily.   Yes [provider]  furosemide (LASIX) 80 MG tablet Take 80-160 mg by mouth See admin instructions. 176m in the morning and 864min the afternoon   Yes [provider]  levothyroxine (SYNTHROID, LEVOTHROID) 75 MCG tablet Take 75 mcg by mouth daily before breakfast.   Yes [provider]  magnesium hydroxide (MILK OF MAGNESIA) 400 MG/5ML suspension Take 30 mLs by mouth daily as needed for mild constipation or moderate constipation.   Yes [provider]  metoprolol tartrate (LOPRESSOR) 50 MG tablet Take 50 mg by mouth 2 (two) times daily.   Yes [provider]  NIFEdipine (PROCARDIA XL/NIFEDICAL XL) 60 MG 24 hr tablet Take  60 mg by mouth daily.   Yes [provider]  omeprazole (PRILOSEC) 20 MG capsule Take 20 mg by mouth at bedtime.   Yes [provider]  potassium chloride SA (K-DUR,KLOR-CON) 20 MEQ tablet Take 20 mEq by mouth every morning.   Yes [provider]  traMADol (ULTRAM) 50 MG tablet Take 50 mg by mouth daily as needed for moderate pain or severe pain.   Yes [provider]    Physical Exam: Vitals:   10/16/18 2230 10/16/18 2233 10/16/18 2235 10/17/18 0015  BP:  (!) 186/63 (!) 186/63  (!) 157/60  Pulse: 78 78  79  Resp: (!) 25 20  (!) 23  Temp:  98.1 F (36.7 C)    TempSrc:  Oral    SpO2: 94% 94%  94%  Weight:   68 kg   Height:   _0  (1.575 m)     Constitutional: NAD, calm, comfortable Eyes: PERRL, lids and conjunctivae normal ENMT: Mucous membranes are moist. Posterior pharynx clear of any exudate or lesions.Normal dentition.  Neck: normal, supple, no masses, no thyromegaly Respiratory: clear to auscultation bilaterally, no wheezing, no crackles. Normal respiratory effort. No accessory muscle use.  Cardiovascular: Regular rate and rhythm, no murmurs / rubs / gallops. No extremity edema. 2+ pedal pulses. No carotid bruits.  Abdomen: no tenderness, no masses palpated. No hepatosplenomegaly. Bowel sounds positive.  Musculoskeletal: no clubbing / cyanosis. No joint deformity upper and lower extremities. Good ROM, no contractures. Normal muscle tone.  Skin: no rashes, lesions, ulcers. No induration Neurologic: CN 2-12 grossly intact. Sensation intact, DTR normal. Strength 5/5 in all 4.  Psychiatric: Normal judgment and insight. Alert and oriented x 3. Normal mood.    Labs on Admission: I have personally reviewed following labs and imaging studies  CBC: Recent Labs  Lab 10/16/18 2250  WBC 8.9  NEUTROABS 7.0  HGB 9.9*  HCT 31.5*  MCV 88.5  PLT 983   Basic Metabolic Panel: Recent Labs  Lab 10/16/18 2250  NA 133*  K 3.3*  CL 97*  CO2 25  GLUCOSE 136*  BUN 40*  CREATININE 1.42*  CALCIUM 8.3*   GFR: Estimated Creatinine Clearance: 20.5 mL/min (A) (by C-G formula based on SCr of 1.42 mg/dL (H)). Liver Function Tests: Recent Labs  Lab 10/16/18 2250  AST 19  ALT 19  ALKPHOS 102  BILITOT 0.8  PROT 7.0  ALBUMIN 2.7*   Recent Labs  Lab 10/16/18 2250  LIPASE 37   No results for input(s): AMMONIA in the last 168 hours. Coagulation Profile: No results for input(s): INR, PROTIME in the last 168 hours. Cardiac Enzymes: No  results for input(s): CKTOTAL, CKMB, CKMBINDEX, TROPONINI in the last 168 hours. BNP (last 3 results) No results for input(s): PROBNP in the last 8760 hours. HbA1C: No results for input(s): HGBA1C in the last 72 hours. CBG: No results for input(s): GLUCAP in the last 168 hours. Lipid Profile: No results for input(s): CHOL, HDL, LDLCALC, TRIG, CHOLHDL, LDLDIRECT in the last 72 hours. Thyroid Function Tests: No results for input(s): TSH, T4TOTAL, FREET4, T3FREE, THYROIDAB in the last 72 hours. Anemia Panel: No results for input(s): VITAMINB12, FOLATE, FERRITIN, TIBC, IRON, RETICCTPCT in the last 72 hours. Urine analysis:    Component Value Date/Time   COLORURINE STRAW (A) 10/16/2018 2237   APPEARANCEUR CLEAR 10/16/2018 2237   LABSPEC 1.004 (L) 10/16/2018 2237   PHURINE 6.0 10/16/2018 2237   GLUCOSEU NEGATIVE 10/16/2018 2237   HGBUR NEGATIVE 10/16/2018 2237  BILIRUBINUR NEGATIVE 10/16/2018 2237   [place] 10/16/2018 2237   PROTEINUR NEGATIVE 10/16/2018 2237   NITRITE NEGATIVE 10/16/2018 2237   LEUKOCYTESUR NEGATIVE 10/16/2018 2237    Radiological Exams on Admission: Dg Ribs Unilateral W/chest Right  Result Date: 10/16/2018 CLINICAL DATA:  Right flank pain. EXAM: RIGHT RIBS AND CHEST - 3+ VIEW COMPARISON:  None. FINDINGS: Radio-opaque marker has been placed on the skin at the site of patient concern. The cardio pericardial silhouette is enlarged. Interstitial markings are diffusely coarsened with chronic features. There is pulmonary vascular congestion without overt pulmonary edema. Bones are diffusely demineralized. Oblique views of the right ribs show no evidence for an acute displaced right-sided rib fracture. IMPRESSION: No acute displaced right-sided rib fracture evident. Osteopenia hinders assessment for nondisplaced fracture. Diffuse chronic underlying interstitial changes with a peripheral and basilar predominance. Features suggest fibrotic lung disease. Cardiomegaly  Electronically Signed   By: Misty Stanley M.D.   On: 10/16/2018 23:19    EKG: Independently reviewed.  Assessment/Plan Principal Problem:   Pleuritic chest pain Active Problems:   HTN (hypertension)   CKD (chronic kidney disease) stage 3, GFR 30-59 ml/min (HCC)    1. Pleuritic chest pain - 1. D.Dimer 7 2. VQ scan in AM 3. Heparin gtt empirically in the mean time 4. Serial trops 5. Tele monitor 2. HTN - continue home BP meds 3. CKD stage 3 - 1. Chronic and known "for a long time" per patient 2. H/o R nephrectomy  DVT prophylaxis: Heparin gtt Code Status: DNR - yellow form at bedside Family Communication: No family in room Disposition Plan: SNF after admit Consults called: None Admission status: Place in obs    , [facility] Pager [phone] Only works nights!  If 7AM-7PM, please contact the primary day team physician taking care of patient  [email or web address] Password [email or web address]  10/17/2018, 12:47 AM

## 2018-10-17 NOTE — Plan of Care (Signed)

## 2018-10-17 NOTE — Care Management Obs Status (Signed)
[facility] NOTIFICATION   Patient Details  Name: Jaclyn Long MRN: [id number] Date of Birth: 07/15/1919   Medicare Observation Status Notification Given:  Yes    Shelda Altes 10/17/2018, 11:21 AM

## 2018-10-17 NOTE — Plan of Care (Signed)
  Problem: Education: Goal: Knowledge of General Education information will improve Description Including pain rating scale, medication(s)/side effects and non-pharmacologic comfort measures 10/17/2018 1330 by Rance Muir, RN Outcome: Adequate for Discharge 10/17/2018 1211 by Rance Muir, RN Outcome: Progressing   Problem: Health Behavior/Discharge Planning: Goal: Ability to manage health-related needs will improve 10/17/2018 1330 by Rance Muir, RN Outcome: Adequate for Discharge 10/17/2018 1211 by Rance Muir, RN Outcome: Progressing   Problem: Clinical Measurements: Goal: Ability to maintain clinical measurements within normal limits will improve 10/17/2018 1330 by Rance Muir, RN Outcome: Adequate for Discharge 10/17/2018 1211 by Rance Muir, RN Outcome: Progressing Goal: Will remain free from infection 10/17/2018 1330 by Rance Muir, RN Outcome: Adequate for Discharge 10/17/2018 1211 by Rance Muir, RN Outcome: Progressing Goal: Diagnostic test results will improve 10/17/2018 1330 by Rance Muir, RN Outcome: Adequate for Discharge 10/17/2018 1211 by Rance Muir, RN Outcome: Progressing Goal: Respiratory complications will improve 10/17/2018 1330 by Rance Muir, RN Outcome: Adequate for Discharge 10/17/2018 1211 by Rance Muir, RN Outcome: Progressing Goal: Cardiovascular complication will be avoided 10/17/2018 1330 by Rance Muir, RN Outcome: Adequate for Discharge 10/17/2018 1211 by Rance Muir, RN Outcome: Progressing   Problem: Activity: Goal: Risk for activity intolerance will decrease 10/17/2018 1330 by Rance Muir, RN Outcome: Adequate for Discharge 10/17/2018 1211 by Rance Muir, RN Outcome: Progressing   Problem: Nutrition: Goal: Adequate nutrition will be maintained 10/17/2018 1330 by Rance Muir, RN Outcome: Adequate for Discharge 10/17/2018 1211 by Rance Muir, RN Outcome: Progressing   Problem: Coping: Goal: Level of anxiety will decrease 10/17/2018 1330 by Rance Muir, RN Outcome: Adequate for  Discharge 10/17/2018 1211 by Rance Muir, RN Outcome: Progressing   Problem: Elimination: Goal: Will not experience complications related to bowel motility 10/17/2018 1330 by Rance Muir, RN Outcome: Adequate for Discharge 10/17/2018 1211 by Rance Muir, RN Outcome: Progressing Goal: Will not experience complications related to urinary retention 10/17/2018 1330 by Rance Muir, RN Outcome: Adequate for Discharge 10/17/2018 1211 by Rance Muir, RN Outcome: Progressing   Problem: Pain Managment: Goal: General experience of comfort will improve 10/17/2018 1330 by Rance Muir, RN Outcome: Adequate for Discharge 10/17/2018 1211 by Rance Muir, RN Outcome: Progressing   Problem: Safety: Goal: Ability to remain free from injury will improve 10/17/2018 1330 by Rance Muir, RN Outcome: Adequate for Discharge 10/17/2018 1211 by Rance Muir, RN Outcome: Progressing   Problem: Skin Integrity: Goal: Risk for impaired skin integrity will decrease 10/17/2018 1330 by Rance Muir, RN Outcome: Adequate for Discharge 10/17/2018 1211 by Rance Muir, RN Outcome: Progressing

## 2018-10-17 NOTE — Discharge Summary (Signed)
Physician Discharge Summary  Jaclyn Long KGU:[id number] DOB: 16-Aug-1919 DOA: 10/16/2018  PCP: Jaclyn Corrigan, MD  Admit date: 10/16/2018  Discharge date: 10/17/2018  Admitted From:SNF  Disposition:  SNF  Recommendations for Outpatient Follow-up:  1. Follow up with PCP in 1-2 weeks 2. Continue on pain medications as prior for musculoskeletal chest pain  Home Health: None  Equipment/Devices: None  Discharge Condition: Stable  CODE STATUS: DNR  Diet recommendation: Heart Healthy  Brief/Interim Summary: Per HPI from Dr. Alcario Long: Jaclyn Long is a 84 y.o. female with medical history significant of R sided BRCA, R nephrectomy with resulting CKD, HTN. Patient presents to the ED with c/o R lower rib pain.  Lateral.  Sharp.  Worse with deep breaths or movement or palpation of chest wall.  Patient was noted to have an elevated d-dimer as well as IV contrast dye allergy for which she underwent VQ scan with no pulmonary embolus noted.  She does not have cough or shortness of breath or hypoxemia.  She denies any swelling to her lower extremities.  She is noted to have significant tenderness to palpation over the rib cage and is suspected to have musculoskeletal chest pain for which she does have tramadol as needed.  She is to continue the same for now.  No further acute events noted during this short hospitalization.  Discharge Diagnoses:  Principal Problem:   Pleuritic chest pain Active Problems:   HTN (hypertension)   CKD (chronic kidney disease) stage 3, GFR 30-59 ml/min (HCC)  Principal discharge diagnosis: Atypical chest pain secondary to musculoskeletal etiology.  Discharge Instructions  Discharge Instructions    Diet - low sodium heart healthy   Complete by:  As directed    Increase activity slowly   Complete by:  As directed      Allergies as of 10/17/2018      Reactions   Ciprofloxacin    Unknown reaction-Listed on MAR   Ivp Dye [iodinated Diagnostic Agents]     Unknown reaction-Listed on MAR   Penicillins    Has patient had a PCN reaction causing immediate rash, facial/tongue/throat swelling, SOB or lightheadedness with hypotension: Unknown Has patient had a PCN reaction causing severe rash involving mucus membranes or skin necrosis: Unknown Has patient had a PCN reaction that required hospitalization: Unknown Has patient had a PCN reaction occurring within the last 10 years: Unknown If all of the above answers are "NO", then may proceed with Cephalosporin use.   Sulfa Antibiotics    Unknown reaction-Listed on MAR      Medication List    TAKE these medications   acetaminophen 325 MG tablet Commonly known as:  TYLENOL Take 650 mg by mouth 2 (two) times daily.   cycloSPORINE 0.05 % ophthalmic emulsion Commonly known as:  RESTASIS Place 1 drop into both eyes every morning.   diclofenac sodium 1 % Gel Commonly known as:  VOLTAREN Apply 1 application topically every 6 (six) hours as needed (for anti-inflammatory).   docusate sodium 100 MG capsule Commonly known as:  COLACE Take 100 mg by mouth daily.   furosemide 80 MG tablet Commonly known as:  LASIX Take 80-160 mg by mouth See admin instructions. 177m in the morning and 885min the afternoon   levothyroxine 75 MCG tablet Commonly known as:  SYNTHROID, LEVOTHROID Take 75 mcg by mouth daily before breakfast.   magnesium hydroxide 400 MG/5ML suspension Commonly known as:  MILK OF MAGNESIA Take 30 mLs by mouth daily as needed for mild constipation  or moderate constipation.   metoprolol tartrate 50 MG tablet Commonly known as:  LOPRESSOR Take 50 mg by mouth 2 (two) times daily.   NIFEdipine 60 MG 24 hr tablet Commonly known as:  PROCARDIA XL/NIFEDICAL XL Take 60 mg by mouth daily.   omeprazole 20 MG capsule Commonly known as:  PRILOSEC Take 20 mg by mouth at bedtime.   potassium chloride SA 20 MEQ tablet Commonly known as:  K-DUR,KLOR-CON Take 20 mEq by mouth every  morning.   traMADol 50 MG tablet Commonly known as:  ULTRAM Take 50 mg by mouth daily as needed for moderate pain or severe pain.   Vitamin D 50 MCG (2000 UT) Caps Take 1 capsule by mouth every morning.      Follow-up Information    Jaclyn Corrigan, MD Follow up in 1 week(s).   Specialty:  Internal Medicine Contact information: [place] [address] [phone]          Allergies  Allergen Reactions  . Ciprofloxacin     Unknown reaction-Listed on MAR  . Ivp Dye [Iodinated Diagnostic Agents]     Unknown reaction-Listed on MAR  . Penicillins     Has patient had a PCN reaction causing immediate rash, facial/tongue/throat swelling, SOB or lightheadedness with hypotension: Unknown Has patient had a PCN reaction causing severe rash involving mucus membranes or skin necrosis: Unknown Has patient had a PCN reaction that required hospitalization: Unknown Has patient had a PCN reaction occurring within the last 10 years: Unknown If all of the above answers are "NO", then may proceed with Cephalosporin use.   . Sulfa Antibiotics     Unknown reaction-Listed on MAR    Consultations:  None   Procedures/Studies: Dg Ribs Unilateral W/chest Right  Result Date: 10/16/2018 CLINICAL DATA:  Right flank pain. EXAM: RIGHT RIBS AND CHEST - 3+ VIEW COMPARISON:  None. FINDINGS: Radio-opaque marker has been placed on the skin at the site of patient concern. The cardio pericardial silhouette is enlarged. Interstitial markings are diffusely coarsened with chronic features. There is pulmonary vascular congestion without overt pulmonary edema. Bones are diffusely demineralized. Oblique views of the right ribs show no evidence for an acute displaced right-sided rib fracture. IMPRESSION: No acute displaced right-sided rib fracture evident. Osteopenia hinders assessment for nondisplaced fracture. Diffuse chronic underlying interstitial changes with a peripheral and basilar  predominance. Features suggest fibrotic lung disease. Cardiomegaly Electronically Signed   By: Misty Stanley M.D.   On: 10/16/2018 23:19   Nm Pulmonary Perfusion  Result Date: 10/17/2018 CLINICAL DATA:  Patient refused Vent, chest pain right side x one day EXAM: NUCLEAR MEDICINE PERFUSION SCAN TECHNIQUE: Perfusion images were obtained in multiple projections after intravenous injection of radiopharmaceutical. Sequential dynamic ventilation images were obtained in standard planar projections following inhalation of radiopharmaceutical. RADIOPHARMACEUTICALS:  4.4 mCi Tc60mMAA-IV COMPARISON:  Chest and ribs dated 10/16/2018 FINDINGS: Ventilation: Not performed. Perfusion: No wedge shaped peripheral perfusion defects to suggest acute pulmonary embolism. IMPRESSION: No evidence of a pulmonary thromboembolism.  Normal perfusion study. Electronically Signed   By: DLajean ManesM.D.   On: 10/17/2018 11:37      Discharge Exam: Vitals:   10/17/18 0800 10/17/18 0920  BP: (!) 119/46 (!) 153/74  Pulse: 67 84  Resp: 20 18  Temp:  98.4 F (36.9 C)  SpO2: (!) 89% 93%   Vitals:   10/17/18 0600 10/17/18 0700 10/17/18 0800 10/17/18 0920  BP: (!) 119/91 (!) 137/56 (!) 119/46 (!) 153/74  Pulse:  80 80 67 84  Resp: (!) 23 (!) _0 Temp:    98.4 F (36.9 C)  TempSrc:    Oral  SpO2: 91% 92% (!) 89% 93%  Weight:      Height:        General: Pt is alert, awake, not in acute distress Cardiovascular: RRR, S1/S2 +, no rubs, no gallops Respiratory: CTA bilaterally, no wheezing, no rhonchi, tenderness to palpation over left chest wall Abdominal: Soft, NT, ND, bowel sounds + Extremities: no edema, no cyanosis    The results of significant diagnostics from this hospitalization (including imaging, microbiology, ancillary and laboratory) are listed below for reference.     Microbiology: No results found for this or any previous visit (from the past 240 hour(s)).   Labs: BNP (last 3 results) No  results for input(s): BNP in the last 8760 hours. Basic Metabolic Panel: Recent Labs  Lab 10/16/18 2250  NA 133*  K 3.3*  CL 97*  CO2 25  GLUCOSE 136*  BUN 40*  CREATININE 1.42*  CALCIUM 8.3*   Liver Function Tests: Recent Labs  Lab 10/16/18 2250  AST 19  ALT 19  ALKPHOS 102  BILITOT 0.8  PROT 7.0  ALBUMIN 2.7*   Recent Labs  Lab 10/16/18 2250  LIPASE 37   No results for input(s): AMMONIA in the last 168 hours. CBC: Recent Labs  Lab 10/16/18 2250  WBC 8.9  NEUTROABS 7.0  HGB 9.9*  HCT 31.5*  MCV 88.5  PLT 331   Cardiac Enzymes: Recent Labs  Lab 10/16/18 2250 10/17/18 0656  TROPONINI <0.03 <0.03   BNP: Invalid input(s): POCBNP CBG: No results for input(s): GLUCAP in the last 168 hours. D-Dimer Recent Labs    10/16/18 2250  DDIMER 7.28*   Hgb A1c No results for input(s): HGBA1C in the last 72 hours. Lipid Profile No results for input(s): CHOL, HDL, LDLCALC, TRIG, CHOLHDL, LDLDIRECT in the last 72 hours. Thyroid function studies No results for input(s): TSH, T4TOTAL, T3FREE, THYROIDAB in the last 72 hours.  Invalid input(s): FREET3 Anemia work up No results for input(s): VITAMINB12, FOLATE, FERRITIN, TIBC, IRON, RETICCTPCT in the last 72 hours. Urinalysis    Component Value Date/Time   COLORURINE STRAW (A) 10/16/2018 2237   APPEARANCEUR CLEAR 10/16/2018 2237   LABSPEC 1.004 (L) 10/16/2018 2237   PHURINE 6.0 10/16/2018 2237   GLUCOSEU NEGATIVE 10/16/2018 2237   HGBUR NEGATIVE 10/16/2018 2237   BILIRUBINUR NEGATIVE 10/16/2018 2237   KETONESUR NEGATIVE 10/16/2018 2237   PROTEINUR NEGATIVE 10/16/2018 2237   NITRITE NEGATIVE 10/16/2018 2237   LEUKOCYTESUR NEGATIVE 10/16/2018 2237   Sepsis Labs Invalid input(s): PROCALCITONIN,  WBC,  LACTICIDVEN Microbiology No results found for this or any previous visit (from the past 240 hour(s)).   Time coordinating discharge: 35 minutes  SIGNED:   Rodena Goldmann, DO [facility] 10/17/2018, 12:23 PM Pager [phone]  If 7PM-7AM, please contact night-coverage [email or web address] Password [email or web address]

## 2019-08-10 DEATH — deceased

## 2019-10-24 IMAGING — NM NM PULMONARY PERF PARTICULATE
8 series · 8 of 8 positions shown · non-contrast
Comparison: Chest and ribs dated 10/16/2018

CLINICAL DATA: Patient refused Vent, chest pain right side x one
day

EXAM:
NUCLEAR MEDICINE PERFUSION SCAN
TECHNIQUE: Perfusion images were obtained in multiple projections after
intravenous injection of radiopharmaceutical. Sequential dynamic
ventilation images were obtained in standard planar projections
following inhalation of radiopharmaceutical.
RADIOPHARMACEUTICALS:  4.4 mCi Ec44m MAA-IV

[Series 1: ant/post perf · 4.14mm/px · 1 of 1 slices shown (1 of 2)]
[im 1/1]
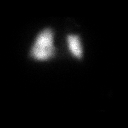

[Series 1: ant/post perf · 4.14mm/px · 1 of 1 slices shown (2 of 2)]
[im 1/1]
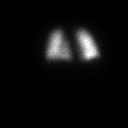

[Series 2: lao/rpo perf · 4.14mm/px · 1 of 1 slices shown (1 of 2)]
[im 1/1]
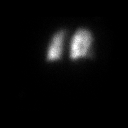

[Series 2: lao/rpo perf · 4.14mm/px · 1 of 1 slices shown (2 of 2)]
[im 1/1]
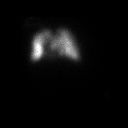

[Series 3: lt lat/rt lat perf · 4.14mm/px · 1 of 1 slices shown (1 of 2)]
[im 1/1]
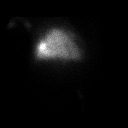

[Series 3: lt lat/rt lat perf · 4.14mm/px · 1 of 1 slices shown (2 of 2)]
[im 1/1]
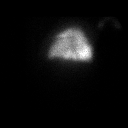

[Series 4: lpo/rao perf · 4.14mm/px · 1 of 1 slices shown (1 of 2)]
[im 1/1]
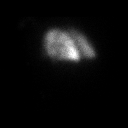

[Series 4: lpo/rao perf · 4.14mm/px · 1 of 1 slices shown (2 of 2)]
[im 1/1]
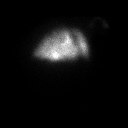

[8 of 8 positions shown; findings below may reference images not displayed]

FINDINGS: Ventilation: Not performed.

Perfusion: No wedge shaped peripheral perfusion defects to suggest
acute pulmonary embolism.
IMPRESSION: No evidence of a pulmonary thromboembolism.  Normal perfusion study.
